# Patient Record
Sex: Male | Born: 1970 | Race: Black or African American | Hispanic: No | Marital: Single | Smoking: Never smoker
Health system: Southern US, Community
[De-identification: ages and names within clinical notes are randomized; demographics above are authoritative.]

## PROBLEM LIST (undated history)

## (undated) DIAGNOSIS — J45909 Unspecified asthma, uncomplicated: Secondary | ICD-10-CM

## (undated) DIAGNOSIS — K56609 Unspecified intestinal obstruction, unspecified as to partial versus complete obstruction: Secondary | ICD-10-CM

## (undated) DIAGNOSIS — K567 Ileus, unspecified: Secondary | ICD-10-CM

## (undated) DIAGNOSIS — J40 Bronchitis, not specified as acute or chronic: Secondary | ICD-10-CM

## (undated) DIAGNOSIS — K529 Noninfective gastroenteritis and colitis, unspecified: Secondary | ICD-10-CM

## (undated) DIAGNOSIS — K9189 Other postprocedural complications and disorders of digestive system: Secondary | ICD-10-CM

## (undated) HISTORY — PX: BOWEL RESECTION: SHX1257

## (undated) HISTORY — PX: ABDOMINAL SURGERY: SHX537

---

## 2007-06-24 ENCOUNTER — Emergency Department (HOSPITAL_COMMUNITY): Admission: EM | Admit: 2007-06-24 | Discharge: 2007-06-24 | Payer: Self-pay | Admitting: Emergency Medicine

## 2009-02-19 ENCOUNTER — Emergency Department (HOSPITAL_COMMUNITY): Admission: EM | Admit: 2009-02-19 | Discharge: 2009-02-19 | Payer: Self-pay | Admitting: Emergency Medicine

## 2009-03-12 ENCOUNTER — Emergency Department (HOSPITAL_COMMUNITY): Admission: EM | Admit: 2009-03-12 | Discharge: 2009-03-12 | Payer: Self-pay | Admitting: Emergency Medicine

## 2009-09-04 ENCOUNTER — Emergency Department (HOSPITAL_COMMUNITY): Admission: EM | Admit: 2009-09-04 | Discharge: 2009-09-04 | Payer: Self-pay | Admitting: Emergency Medicine

## 2010-04-25 LAB — GC/CHLAMYDIA PROBE AMP, URINE: Chlamydia, Swab/Urine, PCR: POSITIVE — AB

## 2010-04-26 LAB — URINE MICROSCOPIC-ADD ON

## 2010-04-26 LAB — URINALYSIS, ROUTINE W REFLEX MICROSCOPIC
Glucose, UA: NEGATIVE mg/dL
Nitrite: NEGATIVE
Protein, ur: NEGATIVE mg/dL
Urobilinogen, UA: 1 mg/dL (ref 0.0–1.0)

## 2010-04-26 LAB — URINE CULTURE

## 2010-04-26 LAB — GC/CHLAMYDIA PROBE AMP, GENITAL: Chlamydia, DNA Probe: POSITIVE — AB

## 2010-04-29 LAB — URINALYSIS, ROUTINE W REFLEX MICROSCOPIC
Bilirubin Urine: NEGATIVE
Ketones, ur: NEGATIVE mg/dL
Protein, ur: NEGATIVE mg/dL
Specific Gravity, Urine: 1.039 — ABNORMAL HIGH (ref 1.005–1.030)
pH: 7 (ref 5.0–8.0)

## 2010-07-25 ENCOUNTER — Emergency Department: Payer: Self-pay | Admitting: Internal Medicine

## 2010-08-04 ENCOUNTER — Emergency Department: Payer: Self-pay | Admitting: Emergency Medicine

## 2010-08-18 ENCOUNTER — Emergency Department: Payer: Self-pay | Admitting: Emergency Medicine

## 2010-08-21 ENCOUNTER — Emergency Department (HOSPITAL_COMMUNITY)
Admission: EM | Admit: 2010-08-21 | Discharge: 2010-08-21 | Disposition: A | Discharge status: Home / Self Care | Payer: Self-pay | Attending: Emergency Medicine | Admitting: Emergency Medicine

## 2010-08-21 DIAGNOSIS — M6740 Ganglion, unspecified site: Secondary | ICD-10-CM | POA: Insufficient documentation

## 2010-11-04 LAB — D-DIMER, QUANTITATIVE: D-Dimer, Quant: <0.22

## 2010-11-04 LAB — POCT I-STAT, CHEM 8
Calcium, Ion: 1.16
Chloride: 104
Potassium: 4.3
Sodium: 139
TCO2: 27

## 2010-11-04 LAB — DIFFERENTIAL
Lymphocytes Relative: 31
Lymphs Abs: 1.3
Monocytes Relative: 8
Neutro Abs: 2.2

## 2010-11-04 LAB — CBC
HCT: 45.2
MCHC: 34.1
RBC: 5.2
RDW: 13.3

## 2010-11-04 LAB — POCT CARDIAC MARKERS: CKMB, poc: 1.1

## 2011-01-31 ENCOUNTER — Inpatient Hospital Stay: Payer: Self-pay | Admitting: Surgery

## 2011-02-11 LAB — CREATININE, SERUM
Creatinine: 1.38 mg/dL — ABNORMAL HIGH (ref 0.60–1.30)
EGFR (African American): >60

## 2011-06-14 ENCOUNTER — Ambulatory Visit: Payer: Self-pay | Admitting: Pediatrics

## 2011-10-18 ENCOUNTER — Emergency Department (HOSPITAL_COMMUNITY): Payer: Self-pay

## 2011-10-18 ENCOUNTER — Encounter (HOSPITAL_COMMUNITY): Payer: Self-pay | Admitting: *Deleted

## 2011-10-18 DIAGNOSIS — W108XXA Fall (on) (from) other stairs and steps, initial encounter: Secondary | ICD-10-CM | POA: Insufficient documentation

## 2011-10-18 DIAGNOSIS — Z91013 Allergy to seafood: Secondary | ICD-10-CM | POA: Insufficient documentation

## 2011-10-18 DIAGNOSIS — S62309A Unspecified fracture of unspecified metacarpal bone, initial encounter for closed fracture: Secondary | ICD-10-CM | POA: Insufficient documentation

## 2011-10-18 DIAGNOSIS — M545 Low back pain, unspecified: Secondary | ICD-10-CM | POA: Insufficient documentation

## 2011-10-18 DIAGNOSIS — J45909 Unspecified asthma, uncomplicated: Secondary | ICD-10-CM | POA: Insufficient documentation

## 2011-10-18 NOTE — ED Notes (Signed)
Was going down steps and started to fall, threw arm out to catch fall. C/o L hand pain along little finger/ulnar side and low back pain. Denies wrist pain or other pains or injuries. Swelling noted. No obvious deformity or bruising.

## 2011-10-19 ENCOUNTER — Emergency Department (HOSPITAL_COMMUNITY)
Admission: EM | Admit: 2011-10-19 | Discharge: 2011-10-19 | Disposition: A | Discharge status: Home / Self Care | Payer: Self-pay | Attending: Emergency Medicine | Admitting: Emergency Medicine

## 2011-10-19 ENCOUNTER — Emergency Department (HOSPITAL_COMMUNITY): Payer: Self-pay

## 2011-10-19 DIAGNOSIS — S6292XA Unspecified fracture of left wrist and hand, initial encounter for closed fracture: Secondary | ICD-10-CM

## 2011-10-19 HISTORY — DX: Bronchitis, not specified as acute or chronic: J40

## 2011-10-19 HISTORY — DX: Unspecified asthma, uncomplicated: J45.909

## 2011-10-19 HISTORY — DX: Unspecified intestinal obstruction, unspecified as to partial versus complete obstruction: K56.609

## 2011-10-19 MED ORDER — ACETAMINOPHEN 325 MG PO TABS
650.0000 mg | ORAL_TABLET | Freq: Once | ORAL | Status: AC
  Administered 2011-10-19: 650 mg via ORAL
  Filled 2011-10-19: qty 2

## 2011-10-19 MED ORDER — HYDROCODONE-ACETAMINOPHEN 5-325 MG PO TABS: 2.0000 | ORAL_TABLET | ORAL | Status: AC | PRN

## 2011-10-19 NOTE — ED Provider Notes (Signed)
History  This chart was scribed for Doug Sou, MD by Albertha Ghee Rifaie. This patient was seen in room TR04C/TR04C and the patient's care was started at 12:40AM.  CSN: [id number]  Arrival date & time 10/18/11  2245   None     Chief Complaint  Patient presents with  . Hand Injury  . Fall  . Back Pain     The history is provided by the patient. No language interpreter was used.    Gregory Castro is a 41 y.o. male who presents to the Emergency Department complaining of 8 hours of constant left hand pain and lower back pain after he threw his left arm out to catch himself as he was falling down a flight of stairs. The hand pain is worse with use of the hand and the back pain is worse with movement. He denies left wrist pain or other injuries. He denies taking OTC medications at home to improve symptoms. He denies fever, nausea and emesis as associated symptoms. He has a h/o asthma and SBO. Pt denies smoking and alcohol use.   Past Medical History  Diagnosis Date  . Bronchitis   . Asthma   . SBO (small bowel obstruction)     Past Surgical History  Procedure Date  . Abdominal surgery     No family history on file.  History  Substance Use Topics  . Smoking status: Never Smoker   . Smokeless tobacco: Not on file  . Alcohol Use: No      Review of Systems  Constitutional: Negative.   HENT: Negative.   Respiratory: Negative.   Cardiovascular: Negative.   Gastrointestinal: Negative.   Musculoskeletal: Positive for back pain. Negative for gait problem.       Left hand pain  Skin: Negative.   Neurological: Negative.   Hematological: Negative.   Psychiatric/Behavioral: Negative.     Allergies  Shellfish allergy  Home Medications  No current outpatient prescriptions on file.  Triage Vitals: BP 106/70  Pulse 80  Temp 97.5 F (36.4 C) (Oral)  Resp 18  SpO2 96%  Physical Exam  Nursing note and vitals reviewed. Constitutional: He is oriented to person, place,  and time. He appears well-developed and well-nourished.  HENT:  Head: Normocephalic and atraumatic.  Eyes: Conjunctivae are normal. Pupils are equal, round, and reactive to light.  Neck: Neck supple. No tracheal deviation present. No thyromegaly present.  Cardiovascular: Normal rate and regular rhythm.   No murmur heard. Pulmonary/Chest: Effort normal and breath sounds normal.  Abdominal: Soft. Bowel sounds are normal. He exhibits no distension. There is no tenderness.  Musculoskeletal: He exhibits tenderness. He exhibits no edema.       Tender over lumbar spine, tenderness over the right dorsal lateral aspect of left hand. Skin of left hand intact All other extremities no contusion abrasion or tenderness neurovascular intact pelvis stable nontender  Neurological: He is alert and oriented to person, place, and time. Coordination normal.       gait is normal  Skin: Skin is warm and dry. No rash noted.  Psychiatric: He has a normal mood and affect.    ED Course  Procedures (including critical care time)  DIAGNOSTIC STUDIES: Oxygen Saturation is 96% on room air, adequate by my interpretation.    COORDINATION OF CARE: 12:44AM-Informed pt of radiology report showing a left hand fracture. Discussed treatment plan which includes a back x-ray with pt at bedside and pt agreed to plan. Informed pt that he will have to  follow up with a hand specialist.  Splint applied by ortho tech. Comfortable for pt  Labs Reviewed - No data to display Dg Hand Complete Left  10/19/2011  *RADIOLOGY REPORT*  Clinical Data: Fifth metacarpal pain and swelling after fall down steps.  LEFT HAND - COMPLETE 3+ VIEW  Comparison: None.  Findings: Transverse fracture of the distal aspect of the left fifth metacarpal with volar angulation of the distal fracture fragments.  No additional fractures demonstrated.  Degenerative changes in the first metacarpal phalangeal joint.  IMPRESSION: Transverse fracture of the distal left  fifth metacarpal bone with mild volar angulation.   Original Report Authenticated By: Marlon Pel, M.D.      No diagnosis found.  Lumbosacral and hand x-rays reviewed by me  MDM  Case discussed with Dr.Weingold plan prescription Norco, ulnar gutter splint, follow up in office one to 2 days Diagnosis #1 fall #2 closed fracture of left fifth metacarpal #3 contusion to back   I personally performed the services described in this documentation, which was scribed in my presence. The recorded information has been reviewed and considered.        Doug Sou, MD 10/19/11 [phone]

## 2011-10-27 ENCOUNTER — Ambulatory Visit: Payer: Self-pay | Attending: Orthopedic Surgery | Admitting: Occupational Therapy

## 2011-10-27 DIAGNOSIS — IMO0001 Reserved for inherently not codable concepts without codable children: Secondary | ICD-10-CM | POA: Insufficient documentation

## 2011-10-27 DIAGNOSIS — M25549 Pain in joints of unspecified hand: Secondary | ICD-10-CM | POA: Insufficient documentation

## 2012-01-03 ENCOUNTER — Encounter (HOSPITAL_COMMUNITY): Payer: Self-pay | Admitting: Neurology

## 2012-01-03 ENCOUNTER — Emergency Department (HOSPITAL_COMMUNITY): Payer: Self-pay

## 2012-01-03 ENCOUNTER — Emergency Department (HOSPITAL_COMMUNITY)
Admission: EM | Admit: 2012-01-03 | Discharge: 2012-01-03 | Disposition: A | Discharge status: Home / Self Care | Payer: Self-pay | Attending: Emergency Medicine | Admitting: Emergency Medicine

## 2012-01-03 DIAGNOSIS — R109 Unspecified abdominal pain: Secondary | ICD-10-CM | POA: Insufficient documentation

## 2012-01-03 DIAGNOSIS — Z8719 Personal history of other diseases of the digestive system: Secondary | ICD-10-CM | POA: Insufficient documentation

## 2012-01-03 DIAGNOSIS — K5900 Constipation, unspecified: Secondary | ICD-10-CM | POA: Insufficient documentation

## 2012-01-03 DIAGNOSIS — J45909 Unspecified asthma, uncomplicated: Secondary | ICD-10-CM | POA: Insufficient documentation

## 2012-01-03 DIAGNOSIS — Z8709 Personal history of other diseases of the respiratory system: Secondary | ICD-10-CM | POA: Insufficient documentation

## 2012-01-03 LAB — COMPREHENSIVE METABOLIC PANEL
AST: 19 U/L (ref 0–37)
Albumin: 3.5 g/dL (ref 3.5–5.2)
CO2: 29 mEq/L (ref 19–32)
Calcium: 8.9 mg/dL (ref 8.4–10.5)
Creatinine, Ser: 1.37 mg/dL — ABNORMAL HIGH (ref 0.50–1.35)
GFR calc Af Amer: 73 mL/min — ABNORMAL LOW (ref >90)
Glucose, Bld: 96 mg/dL (ref 70–99)
Sodium: 140 mEq/L (ref 135–145)
Total Bilirubin: 0.6 mg/dL (ref 0.3–1.2)
Total Protein: 6.7 g/dL (ref 6.0–8.3)

## 2012-01-03 LAB — CBC WITH DIFFERENTIAL/PLATELET
Basophils Absolute: 0 10*3/uL (ref 0.0–0.1)
Eosinophils Absolute: 0.2 10*3/uL (ref 0.0–0.7)
Eosinophils Relative: 4 % (ref 0–5)
Hemoglobin: 14.7 g/dL (ref 13.0–17.0)
MCH: 29.3 pg (ref 26.0–34.0)
MCV: 83.2 fL (ref 78.0–100.0)
Monocytes Absolute: 0.4 10*3/uL (ref 0.1–1.0)
Neutro Abs: 2.4 10*3/uL (ref 1.7–7.7)
RBC: 5.01 MIL/uL (ref 4.22–5.81)
WBC: 3.7 10*3/uL — ABNORMAL LOW (ref 4.0–10.5)

## 2012-01-03 LAB — URINALYSIS, ROUTINE W REFLEX MICROSCOPIC
Bilirubin Urine: NEGATIVE
Hgb urine dipstick: NEGATIVE
Ketones, ur: NEGATIVE mg/dL
Leukocytes, UA: NEGATIVE
Protein, ur: NEGATIVE mg/dL
Urobilinogen, UA: 0.2 mg/dL (ref 0.0–1.0)

## 2012-01-03 MED ORDER — IBUPROFEN 800 MG PO TABS
800.0000 mg | ORAL_TABLET | Freq: Once | ORAL | Status: AC
  Administered 2012-01-03: 800 mg via ORAL
  Filled 2012-01-03: qty 1

## 2012-01-03 NOTE — ED Notes (Signed)
Pt reporting LLQ abdominal pain radiates across abdomen. Last BM yesterday. Pain 8/10, "cramping". Hx of bowel obstruction x 2. Denying any urinary s/s. No vomiting at this time. Pt a x 4. NAD

## 2012-01-03 NOTE — ED Provider Notes (Signed)
History     CSN: [id number]  Arrival date & time 01/03/12  4098   First MD Initiated Contact with Patient 01/03/12 [phone]      Chief Complaint  Patient presents with  . Abdominal Pain    (Consider location/radiation/quality/duration/timing/severity/associated sxs/prior treatment) HPI Comments: 41 y/o male presents to the ED complaining of lower abdominal pain beginning last night. Describes the pain as cramping, constant rated 8/10. Has not tried any alleviating factors. This morning when he felt as if he needed to move his bowels he was unable to do so. States this feels similar to his bowel obstruction which was operated on last year. Denies nausea, vomiting, fever, chills, chest pain, sob. He does not believe his abdomen looks or feels distended.  Patient is a 41 y.o. male presenting with abdominal pain. The history is provided by the patient.  Abdominal Pain The primary symptoms of the illness include abdominal pain. The primary symptoms of the illness do not include fever, shortness of breath, nausea or vomiting.  Additional symptoms associated with the illness include constipation. Symptoms associated with the illness do not include chills.    Past Medical History  Diagnosis Date  . Bronchitis   . Asthma   . SBO (small bowel obstruction)     Past Surgical History  Procedure Date  . Abdominal surgery     No family history on file.  History  Substance Use Topics  . Smoking status: Never Smoker   . Smokeless tobacco: Not on file  . Alcohol Use: No      Review of Systems  Constitutional: Negative for fever, chills and appetite change.  HENT: Negative.   Respiratory: Negative for shortness of breath.   Cardiovascular: Negative for chest pain.  Gastrointestinal: Positive for abdominal pain and constipation. Negative for nausea and vomiting.  Genitourinary: Negative.   Musculoskeletal: Negative.   Skin: Negative.   Neurological: Negative.     Psychiatric/Behavioral: Negative.     Allergies  Shellfish allergy  Home Medications  No current outpatient prescriptions on file.  BP 111/75  Pulse 69  Temp 97.6 F (36.4 C) (Oral)  Resp 16  SpO2 100%  Physical Exam  Nursing note and vitals reviewed. Constitutional: He is oriented to person, place, and time. He appears well-developed and well-nourished. No distress.  HENT:  Head: Normocephalic and atraumatic.  Mouth/Throat: Oropharynx is clear and moist.  Eyes: Conjunctivae normal are normal.  Neck: Normal range of motion. Neck supple.  Cardiovascular: Normal rate, regular rhythm and normal heart sounds.   Pulmonary/Chest: Effort normal and breath sounds normal.  Abdominal: Soft. Normal appearance and bowel sounds are normal. He exhibits no distension. There is tenderness in the right lower quadrant, periumbilical area and left lower quadrant. There is guarding. There is no rigidity and no rebound.       No peritoneal signs.  Musculoskeletal: Normal range of motion. He exhibits no edema.  Neurological: He is alert and oriented to person, place, and time.  Skin: Skin is warm and dry.  Psychiatric: He has a normal mood and affect. His behavior is normal.    ED Course  Procedures (including critical care time)  Labs Reviewed  CBC WITH DIFFERENTIAL - Abnormal; Notable for the following:    WBC 3.7 (*)     Platelets 113 (*)  PLATELET COUNT CONFIRMED BY SMEAR   All other components within normal limits  COMPREHENSIVE METABOLIC PANEL - Abnormal; Notable for the following:    Creatinine, Ser 1.37 (*)  GFR calc non Af Amer 63 (*)     GFR calc Af Amer 73 (*)     All other components within normal limits  URINALYSIS, ROUTINE W REFLEX MICROSCOPIC   Dg Abd Acute W/chest  01/03/2012  *RADIOLOGY REPORT*  Clinical Data: Abdominal pain, diarrhea  ACUTE ABDOMEN SERIES (ABDOMEN 2 VIEW & CHEST 1 VIEW)  Comparison: Abdominal radiograph dated 03/12/2009  Findings: Lungs are clear.   No pleural effusion or pneumothorax.  Nonobstructive bowel gas pattern.  No evidence of free air under the diaphragm on the upright view.  Visualized osseous structures are within normal limits.  Surgical clip overlying the pelvis.  IMPRESSION: No evidence of acute cardiopulmonary disease.  No evidence of small bowel obstruction or free air.   Original Report Authenticated By: Charline Bills, M.D.      1. Abdominal pain   2. Constipation       MDM  41 year old male with abdominal pain and constipation. Abdomen is soft and nondistended. Acute abdominal series without any acute abnormality. Labs normal. I advised patient to take stool softeners and increase fiber in his diet. I gave him 800 mg ibuprofen in the emergency department today with improvement of his pain. Pain to palpation has decreased on repeat exam. Patient is stable for discharge.       Trevor Mace, PA-C 01/03/12 1150

## 2012-01-04 NOTE — ED Provider Notes (Signed)
Medical screening examination/treatment/procedure(s) were performed by non-physician practitioner and as supervising physician I was immediately available for consultation/collaboration.  Ivanna Kocak, MD 01/04/12 2338 

## 2012-05-10 ENCOUNTER — Emergency Department (HOSPITAL_COMMUNITY)
Admission: EM | Admit: 2012-05-10 | Discharge: 2012-05-10 | Disposition: A | Discharge status: Home / Self Care | Payer: Self-pay | Attending: Emergency Medicine | Admitting: Emergency Medicine

## 2012-05-10 ENCOUNTER — Emergency Department (HOSPITAL_COMMUNITY): Payer: Self-pay

## 2012-05-10 ENCOUNTER — Encounter (HOSPITAL_COMMUNITY): Payer: Self-pay | Admitting: *Deleted

## 2012-05-10 DIAGNOSIS — R197 Diarrhea, unspecified: Secondary | ICD-10-CM | POA: Insufficient documentation

## 2012-05-10 DIAGNOSIS — K529 Noninfective gastroenteritis and colitis, unspecified: Secondary | ICD-10-CM

## 2012-05-10 DIAGNOSIS — J45909 Unspecified asthma, uncomplicated: Secondary | ICD-10-CM | POA: Insufficient documentation

## 2012-05-10 DIAGNOSIS — Z8719 Personal history of other diseases of the digestive system: Secondary | ICD-10-CM | POA: Insufficient documentation

## 2012-05-10 DIAGNOSIS — R1110 Vomiting, unspecified: Secondary | ICD-10-CM | POA: Insufficient documentation

## 2012-05-10 DIAGNOSIS — K5289 Other specified noninfective gastroenteritis and colitis: Secondary | ICD-10-CM | POA: Insufficient documentation

## 2012-05-10 DIAGNOSIS — Z79899 Other long term (current) drug therapy: Secondary | ICD-10-CM | POA: Insufficient documentation

## 2012-05-10 LAB — COMPREHENSIVE METABOLIC PANEL
ALT: 31 U/L (ref 0–53)
Albumin: 3.7 g/dL (ref 3.5–5.2)
Alkaline Phosphatase: 106 U/L (ref 39–117)
Glucose, Bld: 98 mg/dL (ref 70–99)
Potassium: 3.9 mEq/L (ref 3.5–5.1)
Sodium: 139 mEq/L (ref 135–145)
Total Protein: 7.3 g/dL (ref 6.0–8.3)

## 2012-05-10 LAB — CBC WITH DIFFERENTIAL/PLATELET
Basophils Relative: 1 % (ref 0–1)
Eosinophils Absolute: 0.2 10*3/uL (ref 0.0–0.7)
Lymphs Abs: 0.6 10*3/uL — ABNORMAL LOW (ref 0.7–4.0)
MCH: 29.5 pg (ref 26.0–34.0)
Neutro Abs: 2.6 10*3/uL (ref 1.7–7.7)
Neutrophils Relative %: 69 % (ref 43–77)
Platelets: 133 10*3/uL — ABNORMAL LOW (ref 150–400)
RBC: 5.49 MIL/uL (ref 4.22–5.81)

## 2012-05-10 MED ORDER — ONDANSETRON HCL 4 MG PO TABS: 4.0000 mg | ORAL_TABLET | Freq: Three times a day (TID) | ORAL | Status: DC | PRN

## 2012-05-10 MED ORDER — SODIUM CHLORIDE 0.9 % IV BOLUS (SEPSIS)
1000.0000 mL | Freq: Once | INTRAVENOUS | Status: AC
  Administered 2012-05-10: 1000 mL via INTRAVENOUS

## 2012-05-10 MED ORDER — HYDROMORPHONE HCL PF 1 MG/ML IJ SOLN
1.0000 mg | Freq: Once | INTRAMUSCULAR | Status: AC
  Administered 2012-05-10: 1 mg via INTRAVENOUS
  Filled 2012-05-10: qty 1

## 2012-05-10 MED ORDER — ONDANSETRON HCL 4 MG/2ML IJ SOLN
4.0000 mg | Freq: Once | INTRAMUSCULAR | Status: AC
  Administered 2012-05-10: 4 mg via INTRAVENOUS
  Filled 2012-05-10: qty 2

## 2012-05-10 MED ORDER — OXYCODONE-ACETAMINOPHEN 5-325 MG PO TABS: 1.0000 | ORAL_TABLET | Freq: Four times a day (QID) | ORAL | Status: DC | PRN

## 2012-05-10 NOTE — ED Notes (Signed)
Pt reports onset of mid abd pain since yesterday, having n/v/d also. Hx of blockage.

## 2012-05-10 NOTE — ED Notes (Signed)
Pt c/o mid abd pain that started yesterday along with n/v/d. Pt reports his stools have been loose and dark black. Pt reports he hasn't been able to keep any food down. Pt in nad, laying in bed, resp e/u, skin warm and dry.

## 2012-05-10 NOTE — ED Notes (Signed)
Pt asked for urine sample.

## 2012-05-10 NOTE — ED Provider Notes (Signed)
History     CSN: [id number]  Arrival date & time 05/10/12  1059   First MD Initiated Contact with Patient 05/10/12 1230      Chief Complaint  Patient presents with  . Abdominal Pain    (Consider location/radiation/quality/duration/timing/severity/associated sxs/prior treatment) Patient is a 42 y.o. male presenting with abdominal pain.  Abdominal Pain  Pt with history of SBO about a year ago, presumably related to scar tissue from abdominal surgery as an infant. He reports moderate to severe diffuse abdominal cramping since yesterday, associated with multiple episodes of non-blood vomiting and diarrhea. Other people in his household have had GI symptoms recently as well.   Past Medical History  Diagnosis Date  . Bronchitis   . Asthma   . SBO (small bowel obstruction)     Past Surgical History  Procedure Laterality Date  . Abdominal surgery      History reviewed. No pertinent family history.  History  Substance Use Topics  . Smoking status: Never Smoker   . Smokeless tobacco: Not on file  . Alcohol Use: No      Review of Systems  Gastrointestinal: Positive for abdominal pain.   All other systems reviewed and are negative except as noted in HPI.   Allergies  Shellfish allergy  Home Medications   Current Outpatient Rx  Name  Route  Sig  Dispense  Refill  . Chlorphen-Phenyleph-APAP 2-5-325 & 5-325 MG MISC   Oral   Take 2 tablets by mouth.         . diphenhydrAMINE (SOMINEX) 25 MG tablet   Oral   Take 25 mg by mouth daily as needed for allergies or sleep.         . Pseudoeph-Doxylamine-DM-APAP (NYQUIL PO)   Oral   Take 2 tablets by mouth at bedtime as needed (for sleep).           BP 130/79  Pulse 88  Temp(Src) 98.3 F (36.8 C) (Oral)  Resp 16  SpO2 98%  Physical Exam  Nursing note and vitals reviewed. Constitutional: He is oriented to person, place, and time. He appears well-developed and well-nourished.  HENT:  Head: Normocephalic and  atraumatic.  Eyes: EOM are normal. Pupils are equal, round, and reactive to light.  Neck: Normal range of motion. Neck supple.  Cardiovascular: Normal rate, normal heart sounds and intact distal pulses.   Pulmonary/Chest: Effort normal and breath sounds normal.  Abdominal: Soft. Bowel sounds are normal. He exhibits no distension. There is tenderness (diffuse). There is no rebound and no guarding.  Musculoskeletal: Normal range of motion. He exhibits no edema and no tenderness.  Neurological: He is alert and oriented to person, place, and time. He has normal strength. No cranial nerve deficit or sensory deficit.  Skin: Skin is warm and dry. No rash noted.  Psychiatric: He has a normal mood and affect.    ED Course  Procedures (including critical care time)  Labs Reviewed  CBC WITH DIFFERENTIAL - Abnormal; Notable for the following:    WBC 3.8 (*)    Platelets 133 (*)    Lymphs Abs 0.6 (*)    All other components within normal limits  COMPREHENSIVE METABOLIC PANEL - Abnormal; Notable for the following:    GFR calc non Af Amer 83 (*)    All other components within normal limits  LIPASE, BLOOD   Dg Abd Acute W/chest  05/10/2012  *RADIOLOGY REPORT*  Clinical Data: Mid abdominal pain.  History of small bowel obstruction.  ACUTE  ABDOMEN SERIES (ABDOMEN 2 VIEW & CHEST 1 VIEW)  Comparison: Acute abdominal series 01/03/2012.  Findings: Heart size is normal.  The lungs are clear.  The visualized soft tissues and bony thorax are unremarkable.  The bowel gas pattern is unremarkable.  There is no evidence for obstruction or free air.  The axial skeleton is unremarkable.  IMPRESSION: No acute abnormality of the chest or abdomen.   Original Report Authenticated By: Marin Roberts, M.D.      1. Gastroenteritis       MDM  Doubt this is SBO, but given history will send for AAS. Labs done in triage otherwise unremarkable. Will give IVF, pain and nausea meds and reassess.   2:14 PM PT feeling  better, AAS neg for signs of SBO. Will give PO trial and anticipate discharge.      Charles B. Bernette Mayers, MD 05/10/12 1419

## 2012-06-23 ENCOUNTER — Emergency Department (HOSPITAL_COMMUNITY): Payer: Self-pay

## 2012-06-23 ENCOUNTER — Emergency Department (HOSPITAL_COMMUNITY)
Admission: EM | Admit: 2012-06-23 | Discharge: 2012-06-23 | Disposition: A | Discharge status: Home / Self Care | Payer: Self-pay | Attending: Emergency Medicine | Admitting: Emergency Medicine

## 2012-06-23 ENCOUNTER — Encounter (HOSPITAL_COMMUNITY): Payer: Self-pay | Admitting: Emergency Medicine

## 2012-06-23 DIAGNOSIS — J45909 Unspecified asthma, uncomplicated: Secondary | ICD-10-CM | POA: Insufficient documentation

## 2012-06-23 DIAGNOSIS — Z8719 Personal history of other diseases of the digestive system: Secondary | ICD-10-CM | POA: Insufficient documentation

## 2012-06-23 DIAGNOSIS — R109 Unspecified abdominal pain: Secondary | ICD-10-CM

## 2012-06-23 DIAGNOSIS — Z9889 Other specified postprocedural states: Secondary | ICD-10-CM | POA: Insufficient documentation

## 2012-06-23 DIAGNOSIS — R112 Nausea with vomiting, unspecified: Secondary | ICD-10-CM | POA: Insufficient documentation

## 2012-06-23 LAB — CBC WITH DIFFERENTIAL/PLATELET
Basophils Relative: 0 % (ref 0–1)
Eosinophils Absolute: 0 10*3/uL (ref 0.0–0.7)
Eosinophils Relative: 1 % (ref 0–5)
HCT: 38.2 % — ABNORMAL LOW (ref 39.0–52.0)
Hemoglobin: 13.7 g/dL (ref 13.0–17.0)
MCH: 29.3 pg (ref 26.0–34.0)
MCHC: 35.9 g/dL (ref 30.0–36.0)
MCV: 81.6 fL (ref 78.0–100.0)
Monocytes Absolute: 0.4 10*3/uL (ref 0.1–1.0)
Monocytes Relative: 9 % (ref 3–12)

## 2012-06-23 LAB — BASIC METABOLIC PANEL
BUN: 11 mg/dL (ref 6–23)
Calcium: 9.2 mg/dL (ref 8.4–10.5)
Chloride: 102 mEq/L (ref 96–112)
Creatinine, Ser: 1.29 mg/dL (ref 0.50–1.35)

## 2012-06-23 LAB — POCT PREGNANCY, URINE: Preg Test, Ur: NEGATIVE

## 2012-06-23 LAB — LIPASE, BLOOD: Lipase: 26 U/L (ref 11–59)

## 2012-06-23 LAB — AMYLASE: Amylase: 62 U/L (ref 0–105)

## 2012-06-23 MED ORDER — HYDROMORPHONE HCL PF 1 MG/ML IJ SOLN
1.0000 mg | Freq: Once | INTRAMUSCULAR | Status: AC
  Administered 2012-06-23: 1 mg via INTRAVENOUS
  Filled 2012-06-23: qty 1

## 2012-06-23 MED ORDER — ONDANSETRON HCL 4 MG/2ML IJ SOLN
4.0000 mg | Freq: Once | INTRAMUSCULAR | Status: DC
  Filled 2012-06-23: qty 2

## 2012-06-23 MED ORDER — ONDANSETRON HCL 4 MG/2ML IJ SOLN
4.0000 mg | Freq: Once | INTRAMUSCULAR | Status: AC
  Administered 2012-06-23: 4 mg via INTRAVENOUS
  Filled 2012-06-23: qty 2

## 2012-06-23 MED ORDER — HYDROMORPHONE HCL PF 1 MG/ML IJ SOLN
1.0000 mg | Freq: Once | INTRAMUSCULAR | Status: DC
  Filled 2012-06-23: qty 1

## 2012-06-23 MED ORDER — SODIUM CHLORIDE 0.9 % IV BOLUS (SEPSIS)
1000.0000 mL | Freq: Once | INTRAVENOUS | Status: AC
  Administered 2012-06-23: 1000 mL via INTRAVENOUS

## 2012-06-23 MED ORDER — SODIUM CHLORIDE 0.9 % IV BOLUS (SEPSIS): 1000.0000 mL | Freq: Once | INTRAVENOUS | Status: DC

## 2012-06-23 MED ORDER — ONDANSETRON HCL 4 MG PO TABS: 4.0000 mg | ORAL_TABLET | Freq: Four times a day (QID) | ORAL | Status: DC

## 2012-06-23 NOTE — ED Notes (Signed)
Patient also states he had a bowel movement earlier tonight and noticed blood in his stool.

## 2012-06-23 NOTE — ED Notes (Signed)
Patient requesting pain medication at this time. PA, Laveda Norman notified.

## 2012-06-23 NOTE — ED Notes (Signed)
Patient comes in complaining of abdominal pain that started around midnight. Patient also having nausea. Denies any vomiting at this time. Patient states he had a bowel obstructions 9 months ago and pain feels the same.

## 2012-06-23 NOTE — ED Notes (Signed)
Pt given water and crackers for PO challenge

## 2012-06-23 NOTE — ED Notes (Signed)
pT

## 2012-06-23 NOTE — ED Provider Notes (Signed)
History     CSN: [id number]  Arrival date & time 06/23/12  0446   First MD Initiated Contact with Patient 06/23/12 [phone]      Chief Complaint  Patient presents with  . Abdominal Pain    (Consider location/radiation/quality/duration/timing/severity/associated sxs/prior treatment) HPI  42 year old male with prior history of abdominal surgery, small bowel obstruction presents complaining of abdominal pain. Patient reports his pain is been ongoing for the past 6 hours. Pain is throughout his abdomen, a sharp and stabbing, pain similar to prior small bowel obstruction.  He endorsed nausea but without vomiting. His last bowel movement was 6 hours ago, with trace of bright red blood on tissue paper. Denies any rectal pain at that time. Denies headache, lightheadedness, fever, chills, chest pain, shortness of breath, back pain, dysuria, hernia, or rash. No recent trauma, no recent alcohol use.  Past Medical History  Diagnosis Date  . Bronchitis   . Asthma   . SBO (small bowel obstruction)     Past Surgical History  Procedure Laterality Date  . Abdominal surgery      History reviewed. No pertinent family history.  History  Substance Use Topics  . Smoking status: Never Smoker   . Smokeless tobacco: Not on file  . Alcohol Use: No      Review of Systems  Constitutional:       A complete 10 system review of systems was obtained and all systems are negative except as noted in the HPI and PMH.    Allergies  Shellfish allergy  Home Medications  No current outpatient prescriptions on file.  BP 101/65  Pulse 87  Temp(Src) 97.7 F (36.5 C) (Oral)  Resp 20  SpO2 96%  Physical Exam  Nursing note and vitals reviewed. Constitutional: He appears well-developed and well-nourished. No distress.  Awake, alert, nontoxic appearance  HENT:  Head: Atraumatic.  Eyes: Conjunctivae are normal. Right eye exhibits no discharge. Left eye exhibits no discharge.  Neck: Normal range of  motion. Neck supple.  Cardiovascular: Normal rate and regular rhythm.   Pulmonary/Chest: Effort normal. No respiratory distress. He exhibits no tenderness.  Abdominal: Soft. There is tenderness (generalized abdominal tenderness on palpation without guarding or rebound tenderness. Well-healing midabdominal surgical scar without hernia noted.). There is no rebound.  Bowel sounds are decreased  Genitourinary:  No hernia noted.  Musculoskeletal: He exhibits no tenderness.  ROM appears intact, no obvious focal weakness  Neurological: He is alert.  Skin: Skin is warm and dry. No rash noted.  Psychiatric: He has a normal mood and affect.    ED Course  Procedures (including critical care time)  7:03 AM Patient presents with generalized abdominal tenderness. History of small bowel obstruction.  Acute abdominal series shows no radiographic evidence of any acute cardiopulmonary process. Nonspecific bowel gas pattern without complete obstruction. Patient's labs otherwise unremarkable. He is afebrile with stable normal vital sign. Plan is to control his pain, discharge once patient is able to tolerates by mouth.  Care discussed with attending.    8:12 AM Pt continues to endorse abd pain after receiving dilaudid 1mg  1 hr ago.  WIll continue pain management, IVF, and monitor.    9:26 AM Pt is sleeping, no worsening of abdominal pain.  Plan to have pt on clear liquid diet and pt can return in 24 hrs for serial abdominal exam if pain worsen.  Otherwise pt stable for discharge at this time. Pt able to tolerates PO fluid at this time.   Labs Reviewed  CBC WITH DIFFERENTIAL - Abnormal; Notable for the following:    HCT 38.2 (*)    All other components within normal limits  BASIC METABOLIC PANEL - Abnormal; Notable for the following:    Glucose, Bld 102 (*)    GFR calc non Af Amer 67 (*)    GFR calc Af Amer 78 (*)    All other components within normal limits  LIPASE, BLOOD  AMYLASE  OCCULT BLOOD X 1  CARD TO LAB, STOOL  POCT PREGNANCY, URINE  OCCULT BLOOD, POC DEVICE   Dg Abd Acute W/chest  06/23/2012   *RADIOLOGY REPORT*  Clinical Data: Abdominal pain  ACUTE ABDOMEN SERIES (ABDOMEN 2 VIEW & CHEST 1 VIEW)  Comparison: 05/10/2012  Findings: Lungs remain predominately clear.  No pleural effusion or pneumothorax.  Cardiomediastinal contours within normal range.  No free intraperitoneal air. The bowel gas pattern is non- specific without overt obstruction as gas is noted in colon. Organ outlines are normal where seen. Surgical clip projects over the pelvis.  No acute or aggressive osseous abnormality identified.  IMPRESSION: No radiographic evidence of acute cardiopulmonary process.  Nonspecific bowel gas pattern without complete obstruction.   Original Report Authenticated By: Jearld Lesch, M.D.     1. Abdominal pain       MDM  BP 90/57  Pulse 70  Temp(Src) 97.7 F (36.5 C) (Oral)  Resp 18  SpO2 98%  I have reviewed nursing notes and vital signs. I personally reviewed the imaging tests through PACS system  I reviewed available ER/hospitalization records thought the EMR         Fayrene Helper, [place] 06/23/12 1007

## 2012-06-26 NOTE — ED Provider Notes (Signed)
Medical screening examination/treatment/procedure(s) were conducted as a shared visit with non-physician practitioner(s) and myself.  I personally evaluated the patient during the encounter   Loren Racer, MD 06/26/12 [phone]

## 2012-06-29 ENCOUNTER — Encounter (HOSPITAL_COMMUNITY): Payer: Self-pay | Admitting: Emergency Medicine

## 2012-06-29 ENCOUNTER — Emergency Department (HOSPITAL_COMMUNITY)
Admission: EM | Admit: 2012-06-29 | Discharge: 2012-06-29 | Disposition: A | Discharge status: Home / Self Care | Payer: Self-pay | Attending: Emergency Medicine | Admitting: Emergency Medicine

## 2012-06-29 ENCOUNTER — Emergency Department (HOSPITAL_COMMUNITY): Payer: Self-pay

## 2012-06-29 DIAGNOSIS — Z9889 Other specified postprocedural states: Secondary | ICD-10-CM | POA: Insufficient documentation

## 2012-06-29 DIAGNOSIS — K5900 Constipation, unspecified: Secondary | ICD-10-CM | POA: Insufficient documentation

## 2012-06-29 DIAGNOSIS — R1033 Periumbilical pain: Secondary | ICD-10-CM | POA: Insufficient documentation

## 2012-06-29 DIAGNOSIS — Z8719 Personal history of other diseases of the digestive system: Secondary | ICD-10-CM | POA: Insufficient documentation

## 2012-06-29 DIAGNOSIS — R109 Unspecified abdominal pain: Secondary | ICD-10-CM

## 2012-06-29 DIAGNOSIS — J45909 Unspecified asthma, uncomplicated: Secondary | ICD-10-CM | POA: Insufficient documentation

## 2012-06-29 LAB — CBC WITH DIFFERENTIAL/PLATELET
Basophils Absolute: 0 10*3/uL (ref 0.0–0.1)
Basophils Relative: 1 % (ref 0–1)
Eosinophils Absolute: 0.4 10*3/uL (ref 0.0–0.7)
Eosinophils Relative: 10 % — ABNORMAL HIGH (ref 0–5)
Lymphocytes Relative: 29 % (ref 12–46)
MCH: 30.2 pg (ref 26.0–34.0)
MCHC: 36.3 g/dL — ABNORMAL HIGH (ref 30.0–36.0)
MCV: 83.3 fL (ref 78.0–100.0)
Platelets: 164 10*3/uL (ref 150–400)
RDW: 13.3 % (ref 11.5–15.5)
WBC: 4.1 10*3/uL (ref 4.0–10.5)

## 2012-06-29 LAB — COMPREHENSIVE METABOLIC PANEL
ALT: 11 U/L (ref 0–53)
Albumin: 4 g/dL (ref 3.5–5.2)
Alkaline Phosphatase: 87 U/L (ref 39–117)
BUN: 10 mg/dL (ref 6–23)
Chloride: 102 mEq/L (ref 96–112)
Potassium: 3.7 mEq/L (ref 3.5–5.1)
Total Bilirubin: 0.6 mg/dL (ref 0.3–1.2)

## 2012-06-29 LAB — URINALYSIS, ROUTINE W REFLEX MICROSCOPIC
Bilirubin Urine: NEGATIVE
Glucose, UA: NEGATIVE mg/dL
Hgb urine dipstick: NEGATIVE
Ketones, ur: NEGATIVE mg/dL
Leukocytes, UA: NEGATIVE
Nitrite: NEGATIVE
Protein, ur: NEGATIVE mg/dL
Specific Gravity, Urine: 1.023 (ref 1.005–1.030)
Urobilinogen, UA: 1 mg/dL (ref 0.0–1.0)
pH: 6 (ref 5.0–8.0)

## 2012-06-29 LAB — LIPASE, BLOOD: Lipase: 29 U/L (ref 11–59)

## 2012-06-29 MED ORDER — HYDROMORPHONE HCL PF 1 MG/ML IJ SOLN
1.0000 mg | Freq: Once | INTRAMUSCULAR | Status: AC
  Administered 2012-06-29: 1 mg via INTRAVENOUS
  Filled 2012-06-29: qty 1

## 2012-06-29 MED ORDER — IOHEXOL 300 MG/ML  SOLN
100.0000 mL | Freq: Once | INTRAMUSCULAR | Status: AC | PRN
  Administered 2012-06-29: 100 mL via INTRAVENOUS

## 2012-06-29 MED ORDER — IOHEXOL 300 MG/ML  SOLN
50.0000 mL | Freq: Once | INTRAMUSCULAR | Status: AC | PRN
  Administered 2012-06-29: 50 mL via ORAL

## 2012-06-29 MED ORDER — HYDROCODONE-ACETAMINOPHEN 5-325 MG PO TABS: 1.0000 | ORAL_TABLET | ORAL | Status: DC | PRN

## 2012-06-29 NOTE — ED Notes (Signed)
PT. REPORTS MID ABDOMINAL PAIN WITH EMESIS ( X1) ONSET 12 MIDNIGHT , DENIES DIARRHEA /FEVER , PT. STATED HISTORY OF BOWEL OBSTRUCTION / BOWEL RESECTION .

## 2012-06-29 NOTE — ED Provider Notes (Signed)
History     CSN: [id number]  Arrival date & time 06/29/12  0239   First MD Initiated Contact with Patient 06/29/12 0344      Chief Complaint  Patient presents with  . Abdominal Pain    (Consider location/radiation/quality/duration/timing/severity/associated sxs/prior treatment) HPI History provided by pt and prior chart.  Pt presents w/ severe, peri-umbilical pain since 10:21pm yesterday.  Non-radiating.   Has not had a BM in 3 days. No associated fever, N/V/D, hematemesis/hematochezia/melena, urinary sx or testicular pain.  H/o SBO x 2.  He can't say whether or not this feels like his last SBO.  Per prior chart, seen for similar sx on 06/23/12, had a nml acute abd series and labs and prescribed zofran.   Past Medical History  Diagnosis Date  . Bronchitis   . Asthma   . SBO (small bowel obstruction)     Past Surgical History  Procedure Laterality Date  . Abdominal surgery      No family history on file.  History  Substance Use Topics  . Smoking status: Never Smoker   . Smokeless tobacco: Not on file  . Alcohol Use: No      Review of Systems  All other systems reviewed and are negative.    Allergies  Shellfish allergy  Home Medications   Current Outpatient Rx  Name  Route  Sig  Dispense  Refill  . ondansetron (ZOFRAN) 4 MG tablet   Oral   Take 1 tablet (4 mg total) by mouth every 6 (six) hours.   12 tablet   0     BP 115/83  Pulse 79  Temp(Src) 98.7 F (37.1 C) (Oral)  Resp 18  SpO2 98%  Physical Exam  Nursing note and vitals reviewed. Constitutional: He is oriented to person, place, and time. He appears well-developed and well-nourished. No distress.  HENT:  Head: Normocephalic and atraumatic.  Eyes:  Normal appearance  Neck: Normal range of motion.  Cardiovascular: Normal rate and regular rhythm.   Pulmonary/Chest: Effort normal and breath sounds normal. No respiratory distress.  Abdominal: Soft. Bowel sounds are normal. He exhibits no  distension and no mass. There is no rebound and no guarding.  Mid-line surgical scar.  Diffuse tenderness to light palpation w/ guarding.   Genitourinary:  No CVA tenderness  Musculoskeletal: Normal range of motion.  Neurological: He is alert and oriented to person, place, and time.  Skin: Skin is warm and dry. No rash noted.  Psychiatric: He has a normal mood and affect. His behavior is normal.    ED Course  Procedures (including critical care time)  Labs Reviewed  COMPREHENSIVE METABOLIC PANEL - Abnormal; Notable for the following:    Glucose, Bld 117 (*)    GFR calc non Af Amer 71 (*)    GFR calc Af Amer 83 (*)    All other components within normal limits  CBC WITH DIFFERENTIAL - Abnormal; Notable for the following:    MCHC 36.3 (*)    Eosinophils Relative 10 (*)    All other components within normal limits  LIPASE, BLOOD  URINALYSIS, ROUTINE W REFLEX MICROSCOPIC   Dg Abd Acute W/chest  06/29/2012   *RADIOLOGY REPORT*  Clinical Data: Abdominal pain.  ACUTE ABDOMEN SERIES (ABDOMEN 2 VIEW & CHEST 1 VIEW)  Comparison: 06/23/2012.  Findings: The upright chest x-ray is normal and stable.  Two views of the abdomen demonstrate scattered air and some stool throughout the colon and scattered air filled small bowel loops without  significant distention or air fluid levels.  No free air. The soft tissue shadows are maintained.  The bony structures are unremarkable.  IMPRESSION:  1.  No acute cardiopulmonary findings. 2.  Unremarkable abdominal bowel gas pattern.  Possible mild diffuse ileus or gastroenteritis but no findings for obstruction or perforation.   Original Report Authenticated By: Rudie Meyer, M.D.     No diagnosis found.    MDM  42yo M w/ h/o SBO presents w/ severe, peri-umbilical pain w/out associated sx since last night.  Associated w/ constipation; last BM 3 days ago.  Second visit for same this week.  On exam, afebrile, uncomfortable appearing, abd soft, non-distended,  diffusely ttp w/ guarding.  Labs unremarkable.  CT abd/pelvis ordered and pending.  Pt to receive IV dilaudid.  4:42 AM   Pt reports improvement in pain.  Neva Seat, PA-C to dispo.  6:05 AM       Otilio Miu, PA-C 06/29/12 [phone]

## 2012-06-29 NOTE — ED Provider Notes (Signed)
Patient hand off to me from [facility] awaiting abd CT/Pelv for abdominal pain. Pain is well controlled at this time. Pt has hx of multiple previous visits in the ED for similar. CT scan is negative and patient can follow-up with GI.   Rx For Norco given. Discussed briefly with Dr. Nicanor Alcon before dc.  Pt has been advised of the symptoms that warrant their return to the ED. Patient has voiced understanding and has agreed to follow-up with the PCP or specialist.   Dorthula Matas, PA-C 06/29/12 [phone]

## 2012-06-29 NOTE — ED Notes (Signed)
Pt. C/o stabbing mid abdominal pain that started last night around 2230. Pt. States was seen x3 days ago for abdominal pain but this pain is different. Hx of bowel obstruction and bowel resection x9 months ago.

## 2012-07-01 NOTE — ED Provider Notes (Signed)
Medical screening examination/treatment/procedure(s) were performed by non-physician practitioner and as supervising physician I was immediately available for consultation/collaboration.  Jasmine Awe, MD 07/01/12 [phone]

## 2012-07-01 NOTE — ED Provider Notes (Signed)
Medical screening examination/treatment/procedure(s) were performed by non-physician practitioner and as supervising physician I was immediately available for consultation/collaboration.  Jasmine Awe, MD 07/01/12 [phone]

## 2012-10-11 ENCOUNTER — Encounter (HOSPITAL_COMMUNITY): Payer: Self-pay | Admitting: Emergency Medicine

## 2012-10-11 DIAGNOSIS — K5289 Other specified noninfective gastroenteritis and colitis: Secondary | ICD-10-CM | POA: Insufficient documentation

## 2012-10-11 DIAGNOSIS — R1013 Epigastric pain: Secondary | ICD-10-CM | POA: Insufficient documentation

## 2012-10-11 DIAGNOSIS — J45909 Unspecified asthma, uncomplicated: Secondary | ICD-10-CM | POA: Insufficient documentation

## 2012-10-11 LAB — CBC WITH DIFFERENTIAL/PLATELET
Basophils Absolute: 0 10*3/uL (ref 0.0–0.1)
Basophils Relative: 0 % (ref 0–1)
Eosinophils Absolute: 0 10*3/uL (ref 0.0–0.7)
MCH: 29.8 pg (ref 26.0–34.0)
MCHC: 36 g/dL (ref 30.0–36.0)
Neutro Abs: 4.1 10*3/uL (ref 1.7–7.7)
Neutrophils Relative %: 75 % (ref 43–77)
Platelets: 145 10*3/uL — ABNORMAL LOW (ref 150–400)
RDW: 13.1 % (ref 11.5–15.5)

## 2012-10-11 LAB — COMPREHENSIVE METABOLIC PANEL
AST: 37 U/L (ref 0–37)
Albumin: 4.3 g/dL (ref 3.5–5.2)
Alkaline Phosphatase: 72 U/L (ref 39–117)
Chloride: 105 mEq/L (ref 96–112)
Potassium: 3.7 mEq/L (ref 3.5–5.1)
Total Bilirubin: 0.8 mg/dL (ref 0.3–1.2)
Total Protein: 7.6 g/dL (ref 6.0–8.3)

## 2012-10-11 LAB — LIPASE, BLOOD: Lipase: 16 U/L (ref 11–59)

## 2012-10-11 NOTE — ED Notes (Signed)
Pt. reports mid abdominal pain onset yesterday with diarrhea , denies nausea or vomitting , no fever or chills.

## 2012-10-12 ENCOUNTER — Emergency Department (HOSPITAL_COMMUNITY): Payer: Medicaid Other

## 2012-10-12 ENCOUNTER — Emergency Department (HOSPITAL_COMMUNITY)
Admission: EM | Admit: 2012-10-12 | Discharge: 2012-10-12 | Disposition: A | Discharge status: Home / Self Care | Payer: Medicaid Other | Attending: Emergency Medicine | Admitting: Emergency Medicine

## 2012-10-12 DIAGNOSIS — R109 Unspecified abdominal pain: Secondary | ICD-10-CM

## 2012-10-12 HISTORY — DX: Noninfective gastroenteritis and colitis, unspecified: K52.9

## 2012-10-12 LAB — URINALYSIS, ROUTINE W REFLEX MICROSCOPIC
Glucose, UA: NEGATIVE mg/dL
Ketones, ur: NEGATIVE mg/dL
Leukocytes, UA: NEGATIVE
Nitrite: NEGATIVE
Specific Gravity, Urine: 1.016 (ref 1.005–1.030)
pH: 7 (ref 5.0–8.0)

## 2012-10-12 MED ORDER — HYDROMORPHONE HCL PF 1 MG/ML IJ SOLN
1.0000 mg | Freq: Once | INTRAMUSCULAR | Status: AC
  Administered 2012-10-12: 1 mg via INTRAVENOUS
  Filled 2012-10-12: qty 1

## 2012-10-12 MED ORDER — SODIUM CHLORIDE 0.9 % IV BOLUS (SEPSIS)
1000.0000 mL | Freq: Once | INTRAVENOUS | Status: AC
  Administered 2012-10-12: 1000 mL via INTRAVENOUS

## 2012-10-12 MED ORDER — DICYCLOMINE HCL 20 MG PO TABS: 20.0000 mg | ORAL_TABLET | Freq: Two times a day (BID) | ORAL | Status: DC

## 2012-10-12 MED ORDER — HYDROCODONE-ACETAMINOPHEN 5-325 MG PO TABS: 1.0000 | ORAL_TABLET | Freq: Four times a day (QID) | ORAL | Status: DC | PRN

## 2012-10-12 MED ORDER — HYDROMORPHONE HCL PF 1 MG/ML IJ SOLN
INTRAMUSCULAR | Status: AC
  Filled 2012-10-12: qty 1

## 2012-10-12 MED ORDER — HYDROMORPHONE HCL PF 1 MG/ML IJ SOLN
1.0000 mg | Freq: Once | INTRAMUSCULAR | Status: AC
  Administered 2012-10-12: 1 mg via INTRAVENOUS

## 2012-10-12 NOTE — ED Provider Notes (Signed)
Medical screening examination/treatment/procedure(s) were performed by non-physician practitioner and as supervising physician I was immediately available for consultation/collaboration.  Olivia Mackie, MD 10/12/12 [phone]

## 2012-10-12 NOTE — ED Notes (Signed)
Pt reports he has had blood in his stool. MD notified.

## 2012-10-12 NOTE — ED Provider Notes (Signed)
CSN: [id number]     Arrival date & time 10/11/12  2226 History   First MD Initiated Contact with Patient 10/12/12 0122     Chief Complaint  Patient presents with  . Abdominal Pain   (Consider location/radiation/quality/duration/timing/severity/associated sxs/prior Treatment) HPI Comments: Patient presents to the ED with a chief complaint of abdominal pain.  He has a PMH remarkable for SBO.  Patient states that he was recently admitted in [place] for SBO about a month and a half ago.  He has had 2 surgeries.  He states that his pain started 2 days ago, and progressively worsened.  He endorses associated diarrhea, but no n/v.  He states that he thinks he saw some blood in his stool today. He has not tried taking anything to alleviate his symptoms.  Nothing makes his symptoms better or worse.  The history is provided by the patient. No language interpreter was used.    Past Medical History  Diagnosis Date  . Bronchitis   . Asthma   . SBO (small bowel obstruction)   . Gastroenteritis    Past Surgical History  Procedure Laterality Date  . Abdominal surgery    . Bowel resection     No family history on file. History  Substance Use Topics  . Smoking status: Never Smoker   . Smokeless tobacco: Not on file  . Alcohol Use: No    Review of Systems  All other systems reviewed and are negative.    Allergies  Shellfish allergy  Home Medications   Current Outpatient Rx  Name  Route  Sig  Dispense  Refill  . HYDROcodone-acetaminophen (NORCO/VICODIN) 5-325 MG per tablet   Oral   Take 1 tablet by mouth every 4 (four) hours as needed for pain.   15 tablet   0   . ondansetron (ZOFRAN) 4 MG tablet   Oral   Take 1 tablet (4 mg total) by mouth every 6 (six) hours.   12 tablet   0    BP 128/94  Pulse 63  Temp(Src) 98.3 F (36.8 C) (Oral)  Resp 20  SpO2 100% Physical Exam  Nursing note and vitals reviewed. Constitutional: He is oriented to person, place, and time. He appears  well-developed and well-nourished.  HENT:  Head: Normocephalic and atraumatic.  Right Ear: External ear normal.  Left Ear: External ear normal.  Nose: Nose normal.  Mouth/Throat: Oropharynx is clear and moist. No oropharyngeal exudate.  Eyes: Conjunctivae and EOM are normal. Pupils are equal, round, and reactive to light. Right eye exhibits no discharge. Left eye exhibits no discharge. No scleral icterus.  Neck: Normal range of motion. Neck supple. No JVD present.  Cardiovascular: Normal rate, regular rhythm, normal heart sounds and intact distal pulses.  Exam reveals no gallop and no friction rub.   No murmur heard. Pulmonary/Chest: Effort normal and breath sounds normal. No respiratory distress. He has no wheezes. He has no rales. He exhibits no tenderness.  Abdominal: Soft. Bowel sounds are normal. He exhibits no distension and no mass. There is tenderness. There is no rebound and no guarding.  Mid/epigastric abdominal tenderness, no fluid wave or signs of peritonitis.  Musculoskeletal: Normal range of motion. He exhibits no edema and no tenderness.  Neurological: He is alert and oriented to person, place, and time. He has normal reflexes.  CN 3-12 intact  Skin: Skin is warm and dry.  Psychiatric: He has a normal mood and affect. His behavior is normal. Judgment and thought content normal.  ED Course  Procedures (including critical care time) Labs Review Labs Reviewed  CBC WITH DIFFERENTIAL - Abnormal; Notable for the following:    Platelets 145 (*)    All other components within normal limits  COMPREHENSIVE METABOLIC PANEL - Abnormal; Notable for the following:    Glucose, Bld 106 (*)    Creatinine, Ser 1.41 (*)    GFR calc non Af Amer 60 (*)    GFR calc Af Amer 70 (*)    All other components within normal limits  LIPASE, BLOOD  URINALYSIS, ROUTINE W REFLEX MICROSCOPIC   Results for orders placed during the hospital encounter of 10/12/12  CBC WITH DIFFERENTIAL      Result  Value Range   WBC 5.5  4.0 - 10.5 K/uL   RBC 5.20  4.22 - 5.81 MIL/uL   Hemoglobin 15.5  13.0 - 17.0 g/dL   HCT 86.5  78.4 - 69.6 %   MCV 82.7  78.0 - 100.0 fL   MCH 29.8  26.0 - 34.0 pg   MCHC 36.0  30.0 - 36.0 g/dL   RDW 29.5  28.4 - 13.2 %   Platelets 145 (*) 150 - 400 K/uL   Neutrophils Relative % 75  43 - 77 %   Neutro Abs 4.1  1.7 - 7.7 K/uL   Lymphocytes Relative 17  12 - 46 %   Lymphs Abs 0.9  0.7 - 4.0 K/uL   Monocytes Relative 8  3 - 12 %   Monocytes Absolute 0.4  0.1 - 1.0 K/uL   Eosinophils Relative 1  0 - 5 %   Eosinophils Absolute 0.0  0.0 - 0.7 K/uL   Basophils Relative 0  0 - 1 %   Basophils Absolute 0.0  0.0 - 0.1 K/uL  COMPREHENSIVE METABOLIC PANEL      Result Value Range   Sodium 141  135 - 145 mEq/L   Potassium 3.7  3.5 - 5.1 mEq/L   Chloride 105  96 - 112 mEq/L   CO2 24  19 - 32 mEq/L   Glucose, Bld 106 (*) 70 - 99 mg/dL   BUN 9  6 - 23 mg/dL   Creatinine, Ser 4.40 (*) 0.50 - 1.35 mg/dL   Calcium 9.6  8.4 - 10.2 mg/dL   Total Protein 7.6  6.0 - 8.3 g/dL   Albumin 4.3  3.5 - 5.2 g/dL   AST 37  0 - 37 U/L   ALT 22  0 - 53 U/L   Alkaline Phosphatase 72  39 - 117 U/L   Total Bilirubin 0.8  0.3 - 1.2 mg/dL   GFR calc non Af Amer 60 (*) >90 mL/min   GFR calc Af Amer 70 (*) >90 mL/min  LIPASE, BLOOD      Result Value Range   Lipase 16  11 - 59 U/L  URINALYSIS, ROUTINE W REFLEX MICROSCOPIC      Result Value Range   Color, Urine YELLOW  YELLOW   APPearance CLOUDY (*) CLEAR   Specific Gravity, Urine 1.016  1.005 - 1.030   pH 7.0  5.0 - 8.0   Glucose, UA NEGATIVE  NEGATIVE mg/dL   Hgb urine dipstick NEGATIVE  NEGATIVE   Bilirubin Urine NEGATIVE  NEGATIVE   Ketones, ur NEGATIVE  NEGATIVE mg/dL   Protein, ur NEGATIVE  NEGATIVE mg/dL   Urobilinogen, UA 1.0  0.0 - 1.0 mg/dL   Nitrite NEGATIVE  NEGATIVE   Leukocytes, UA NEGATIVE  NEGATIVE  OCCULT BLOOD, POC  DEVICE      Result Value Range   Fecal Occult Bld NEGATIVE  NEGATIVE   Dg Abd Acute  W/chest  10/12/2012   *RADIOLOGY REPORT*  Clinical Data: Abdominal pain.  History of small bowel obstruction.  ACUTE ABDOMEN SERIES (ABDOMEN 2 VIEW & CHEST 1 VIEW)  Comparison: 06/29/2012.  Findings: No significant osseous abnormality.  Lungs are clear. No effusion or pneumothorax.  Cardiomediastinal size and contour are within normal limits.  No abnormal intra-abdominal mass effect or calcification. Nonobstructive bowel gas pattern.  No pneumoperitoneum.  Negative osseous structures.  IMPRESSION: 1. Nonobstructive bowel gas pattern. 2.  Clear chest.   Original Report Authenticated By: Tiburcio Pea      MDM   1. Abdominal  pain, other specified site     Patient with abdominal pain. History of SBO. Will check labs, and abdominal series.  Labs are reassuring, no evidence of SBO. Patient's pain is managed with Dilaudid. He is tolerating by mouth. He is stable and rate for discharge. Followup with PCP. Patient understands and agrees with the plan. Return precautions are given. Patient discussed with Dr. Norlene Campbell, who agrees with the plan.      Roxy Horseman, PA-C 10/12/12 [phone]

## 2012-10-25 ENCOUNTER — Emergency Department (HOSPITAL_COMMUNITY)
Admission: EM | Admit: 2012-10-25 | Discharge: 2012-10-25 | Disposition: A | Discharge status: Home / Self Care | Payer: Medicaid Other | Attending: Emergency Medicine | Admitting: Emergency Medicine

## 2012-10-25 ENCOUNTER — Encounter (HOSPITAL_COMMUNITY): Payer: Self-pay | Admitting: Emergency Medicine

## 2012-10-25 ENCOUNTER — Emergency Department (HOSPITAL_COMMUNITY): Payer: Medicaid Other

## 2012-10-25 DIAGNOSIS — R110 Nausea: Secondary | ICD-10-CM | POA: Insufficient documentation

## 2012-10-25 DIAGNOSIS — R1033 Periumbilical pain: Secondary | ICD-10-CM | POA: Insufficient documentation

## 2012-10-25 DIAGNOSIS — J45909 Unspecified asthma, uncomplicated: Secondary | ICD-10-CM | POA: Insufficient documentation

## 2012-10-25 DIAGNOSIS — Z8719 Personal history of other diseases of the digestive system: Secondary | ICD-10-CM | POA: Insufficient documentation

## 2012-10-25 DIAGNOSIS — Z79899 Other long term (current) drug therapy: Secondary | ICD-10-CM | POA: Insufficient documentation

## 2012-10-25 DIAGNOSIS — R109 Unspecified abdominal pain: Secondary | ICD-10-CM

## 2012-10-25 LAB — COMPREHENSIVE METABOLIC PANEL
ALT: 20 U/L (ref 0–53)
AST: 24 U/L (ref 0–37)
Alkaline Phosphatase: 78 U/L (ref 39–117)
CO2: 28 mEq/L (ref 19–32)
Chloride: 100 mEq/L (ref 96–112)
GFR calc Af Amer: 66 mL/min — ABNORMAL LOW (ref >90)
GFR calc non Af Amer: 57 mL/min — ABNORMAL LOW (ref >90)
Glucose, Bld: 107 mg/dL — ABNORMAL HIGH (ref 70–99)
Potassium: 3.7 mEq/L (ref 3.5–5.1)
Sodium: 138 mEq/L (ref 135–145)
Total Bilirubin: 1.4 mg/dL — ABNORMAL HIGH (ref 0.3–1.2)

## 2012-10-25 LAB — CBC
Hemoglobin: 15.1 g/dL (ref 13.0–17.0)
MCH: 30 pg (ref 26.0–34.0)
Platelets: 150 10*3/uL (ref 150–400)
RBC: 5.04 MIL/uL (ref 4.22–5.81)
WBC: 6.5 10*3/uL (ref 4.0–10.5)

## 2012-10-25 MED ORDER — SODIUM CHLORIDE 0.9 % IV SOLN
1000.0000 mL | INTRAVENOUS | Status: DC
  Administered 2012-10-25: 1000 mL via INTRAVENOUS

## 2012-10-25 MED ORDER — SODIUM CHLORIDE 0.9 % IV SOLN
1000.0000 mL | Freq: Once | INTRAVENOUS | Status: AC
  Administered 2012-10-25: 1000 mL via INTRAVENOUS

## 2012-10-25 MED ORDER — MORPHINE SULFATE 4 MG/ML IJ SOLN
4.0000 mg | Freq: Once | INTRAMUSCULAR | Status: AC
  Administered 2012-10-25: 4 mg via INTRAVENOUS
  Filled 2012-10-25: qty 1

## 2012-10-25 MED ORDER — ONDANSETRON HCL 4 MG/2ML IJ SOLN
4.0000 mg | Freq: Once | INTRAMUSCULAR | Status: AC
  Administered 2012-10-25: 4 mg via INTRAVENOUS
  Filled 2012-10-25: qty 2

## 2012-10-25 NOTE — ED Notes (Addendum)
Pt. Driving. Told he needs to find a ride or wait at least 4 hours before driving since given morphine at 0450. Pt. Verbalized understanding and states he will wait. Per charge, patient can wait in waiting room. Currently alert and oriented x4.

## 2012-10-25 NOTE — ED Notes (Signed)
Pt. reports low abdominal pain with nausea and constipation  onset yesterday .

## 2012-10-25 NOTE — ED Notes (Signed)
Pt reports abdominal pain for the past two weeks, pt states he has a hx of bowel obstruction. Pt states he was seen two weeks ago for abdominal pain and his visit was inconclusive. Pt reports an increase in pain last night at 2230 that has not let up. Pt states he is very uncomfortable. Pt states he has not had a bowel movement in two days. Pt states he is nauseous but that he has not vomited.

## 2012-10-25 NOTE — ED Provider Notes (Signed)
CSN: [id number]     Arrival date & time 10/25/12  0250 History   First MD Initiated Contact with Patient 10/25/12 0404     Chief Complaint  Patient presents with  . Abdominal Pain   (Consider location/radiation/quality/duration/timing/severity/associated sxs/prior Treatment) Patient is a 42 y.o. male presenting with abdominal pain. The history is provided by the patient.  Abdominal Pain He had onset at about 10:30 of severe periumbilical pain without radiation. He rates pain at 8/10. There is associated nausea but no vomiting. He states that he has not had a bowel movement in the last 2 days but has been passing flatus. Passing flatus does not affect the pain. He denies fever, chills, sweats. He has had similar pains before. He has a history of bowel resection and small bowel obstruction.  Past Medical History  Diagnosis Date  . Bronchitis   . Asthma   . SBO (small bowel obstruction)   . Gastroenteritis    Past Surgical History  Procedure Laterality Date  . Abdominal surgery    . Bowel resection     No family history on file. History  Substance Use Topics  . Smoking status: Never Smoker   . Smokeless tobacco: Not on file  . Alcohol Use: No    Review of Systems  Gastrointestinal: Positive for abdominal pain.  All other systems reviewed and are negative.    Allergies  Shellfish allergy  Home Medications   Current Outpatient Rx  Name  Route  Sig  Dispense  Refill  . dicyclomine (BENTYL) 20 MG tablet   Oral   Take 1 tablet (20 mg total) by mouth 2 (two) times daily.   20 tablet   0   . HYDROcodone-acetaminophen (NORCO/VICODIN) 5-325 MG per tablet   Oral   Take 1 tablet by mouth every 6 (six) hours as needed for pain.   13 tablet   0    BP 128/87  Pulse 86  Temp(Src) 98.2 F (36.8 C) (Oral)  Resp 20  SpO2 99% Physical Exam  Nursing note and vitals reviewed.  42 year old male, who is curled in a fetal position and appears uncomfortable, but is in no acute  distress. Vital signs are normal. Oxygen saturation is 99%, which is normal. Head is normocephalic and atraumatic. PERRLA, EOMI. Oropharynx is clear. Neck is nontender and supple without adenopathy or JVD. Back is nontender and there is no CVA tenderness. Lungs are clear without rales, wheezes, or rhonchi. Chest is nontender. Heart has regular rate and rhythm without murmur. Abdomen as midline scar present. Abdomen is soft with moderate periumbilical tenderness. There is no rebound or guarding. There are no masses or hepatosplenomegaly. Peristalsis is hypoactive but present. Extremities have no cyanosis or edema, full range of motion is present. Skin is warm and dry without rash. Neurologic: Mental status is normal, cranial nerves are intact, there are no motor or sensory deficits.  ED Course  Procedures (including critical care time) Labs Review Results for orders placed during the hospital encounter of 10/25/12  CBC      Result Value Range   WBC 6.5  4.0 - 10.5 K/uL   RBC 5.04  4.22 - 5.81 MIL/uL   Hemoglobin 15.1  13.0 - 17.0 g/dL   HCT 78.2  95.6 - 21.3 %   MCV 81.5  78.0 - 100.0 fL   MCH 30.0  26.0 - 34.0 pg   MCHC 36.7 (*) 30.0 - 36.0 g/dL   RDW 08.6  57.8 - 46.9 %  Platelets 150  150 - 400 K/uL  COMPREHENSIVE METABOLIC PANEL      Result Value Range   Sodium 138  135 - 145 mEq/L   Potassium 3.7  3.5 - 5.1 mEq/L   Chloride 100  96 - 112 mEq/L   CO2 28  19 - 32 mEq/L   Glucose, Bld 107 (*) 70 - 99 mg/dL   BUN 14  6 - 23 mg/dL   Creatinine, Ser 1.61 (*) 0.50 - 1.35 mg/dL   Calcium 9.6  8.4 - 09.6 mg/dL   Total Protein 7.5  6.0 - 8.3 g/dL   Albumin 4.3  3.5 - 5.2 g/dL   AST 24  0 - 37 U/L   ALT 20  0 - 53 U/L   Alkaline Phosphatase 78  39 - 117 U/L   Total Bilirubin 1.4 (*) 0.3 - 1.2 mg/dL   GFR calc non Af Amer 57 (*) >90 mL/min   GFR calc Af Amer 66 (*) >90 mL/min   Imaging Review Dg Abd Acute W/chest  10/25/2012   CLINICAL DATA:  Abdominal pain and nausea.  EXAM:  ACUTE ABDOMEN SERIES (ABDOMEN 2 VIEW & CHEST 1 VIEW)  COMPARISON:  10/12/2012.  FINDINGS: There is no evidence of dilated bowel loops or free intraperitoneal air. No radiopaque calculi or other significant radiographic abnormality is seen. There is a surgical clip in the right lower abdomen. Heart size and mediastinal contours are within normal limits. Both lungs are clear.  IMPRESSION: Negative abdominal radiographs.  No acute cardiopulmonary disease.   Electronically Signed   By: Tiburcio Pea   On: 10/25/2012 04:47    MDM   1. Abdominal pain    Abdominal pain. Old records are reviewed and he has multiple ED visits with similar presentations and x-rays have always failed to show any evidence of obstruction. 1 CT scan also failed to show evidence of obstruction. Patient is asking for intravenous Dilaudid stating the last time he was here he got that every 2 hours. Am concerned about drug-seeking behavior and [place] only found to narcotic prescriptions the last 4 months.  5:14 AM Patient is noted to be sleeping soundly after a single dose of morphine 4 mg. Workup is unremarkable and he will be discharged.    Dione Booze, MD 10/25/12 [phone]

## 2012-11-27 ENCOUNTER — Encounter (HOSPITAL_COMMUNITY): Payer: Self-pay | Admitting: Emergency Medicine

## 2012-11-27 ENCOUNTER — Emergency Department (HOSPITAL_COMMUNITY): Payer: Medicaid Other

## 2012-11-27 ENCOUNTER — Emergency Department (HOSPITAL_COMMUNITY)
Admission: EM | Admit: 2012-11-27 | Discharge: 2012-11-27 | Disposition: A | Discharge status: Home / Self Care | Payer: Medicaid Other | Attending: Emergency Medicine | Admitting: Emergency Medicine

## 2012-11-27 DIAGNOSIS — Z79899 Other long term (current) drug therapy: Secondary | ICD-10-CM | POA: Insufficient documentation

## 2012-11-27 DIAGNOSIS — R112 Nausea with vomiting, unspecified: Secondary | ICD-10-CM | POA: Insufficient documentation

## 2012-11-27 DIAGNOSIS — Z8709 Personal history of other diseases of the respiratory system: Secondary | ICD-10-CM | POA: Insufficient documentation

## 2012-11-27 DIAGNOSIS — J45909 Unspecified asthma, uncomplicated: Secondary | ICD-10-CM | POA: Insufficient documentation

## 2012-11-27 DIAGNOSIS — Z8719 Personal history of other diseases of the digestive system: Secondary | ICD-10-CM | POA: Insufficient documentation

## 2012-11-27 DIAGNOSIS — R109 Unspecified abdominal pain: Secondary | ICD-10-CM

## 2012-11-27 DIAGNOSIS — R1084 Generalized abdominal pain: Secondary | ICD-10-CM | POA: Insufficient documentation

## 2012-11-27 DIAGNOSIS — K5900 Constipation, unspecified: Secondary | ICD-10-CM | POA: Insufficient documentation

## 2012-11-27 LAB — CBC WITH DIFFERENTIAL/PLATELET
Basophils Relative: 0 % (ref 0–1)
Eosinophils Relative: 0 % (ref 0–5)
HCT: 48.7 % (ref 39.0–52.0)
Hemoglobin: 17.4 g/dL — ABNORMAL HIGH (ref 13.0–17.0)
Lymphocytes Relative: 5 % — ABNORMAL LOW (ref 12–46)
Lymphs Abs: 0.4 10*3/uL — ABNORMAL LOW (ref 0.7–4.0)
MCH: 29.6 pg (ref 26.0–34.0)
MCV: 83 fL (ref 78.0–100.0)
Monocytes Absolute: 0.4 10*3/uL (ref 0.1–1.0)
Monocytes Relative: 5 % (ref 3–12)
Neutro Abs: 6.4 10*3/uL (ref 1.7–7.7)
RDW: 13 % (ref 11.5–15.5)
WBC: 7.1 10*3/uL (ref 4.0–10.5)

## 2012-11-27 LAB — COMPREHENSIVE METABOLIC PANEL
ALT: 17 U/L (ref 0–53)
Albumin: 4.5 g/dL (ref 3.5–5.2)
Alkaline Phosphatase: 99 U/L (ref 39–117)
BUN: 11 mg/dL (ref 6–23)
CO2: 27 mEq/L (ref 19–32)
Calcium: 9.8 mg/dL (ref 8.4–10.5)
Chloride: 103 mEq/L (ref 96–112)
Creatinine, Ser: 1.16 mg/dL (ref 0.50–1.35)
GFR calc Af Amer: 88 mL/min — ABNORMAL LOW (ref >90)
GFR calc non Af Amer: 76 mL/min — ABNORMAL LOW (ref >90)
Glucose, Bld: 101 mg/dL — ABNORMAL HIGH (ref 70–99)
Potassium: 4 mEq/L (ref 3.5–5.1)
Total Bilirubin: 0.9 mg/dL (ref 0.3–1.2)

## 2012-11-27 MED ORDER — IBUPROFEN 600 MG PO TABS: 600.0000 mg | ORAL_TABLET | Freq: Four times a day (QID) | ORAL | Status: DC | PRN

## 2012-11-27 MED ORDER — HYDROCODONE-ACETAMINOPHEN 5-325 MG PO TABS: 1.0000 | ORAL_TABLET | Freq: Four times a day (QID) | ORAL | Status: DC | PRN

## 2012-11-27 MED ORDER — SODIUM CHLORIDE 0.9 % IV BOLUS (SEPSIS)
1000.0000 mL | Freq: Once | INTRAVENOUS | Status: AC
  Administered 2012-11-27: 1000 mL via INTRAVENOUS

## 2012-11-27 MED ORDER — ONDANSETRON HCL 4 MG/2ML IJ SOLN
4.0000 mg | Freq: Once | INTRAMUSCULAR | Status: AC
  Administered 2012-11-27: 4 mg via INTRAVENOUS
  Filled 2012-11-27: qty 2

## 2012-11-27 MED ORDER — MORPHINE SULFATE 4 MG/ML IJ SOLN
4.0000 mg | Freq: Once | INTRAMUSCULAR | Status: AC
  Administered 2012-11-27: 4 mg via INTRAVENOUS
  Filled 2012-11-27: qty 1

## 2012-11-27 NOTE — ED Notes (Signed)
Pt reports lower abd pain starting last night, feels the need to have a BM but unable to, pt reports vomited x6 this am, pt denies passing gas as well, pt has a hx of SBO x2 2 years ago

## 2012-11-27 NOTE — ED Notes (Signed)
Per family at bedside pt has been having cold sweats and possible fever, oral temp 98.0 in triage

## 2012-11-27 NOTE — ED Notes (Signed)
Pt requesting for blood work to be performed when he gets to a room

## 2012-11-27 NOTE — ED Provider Notes (Addendum)
CSN: [id number]     Arrival date & time 11/27/12  1123 History   First MD Initiated Contact with Patient 11/27/12 1421     Chief Complaint  Patient presents with  . Abdominal Pain   (Consider location/radiation/quality/duration/timing/severity/associated sxs/prior Treatment) HPI Comments: Pt comes in with cc of abd pain. Pt has hx of SBO, and states that he started having abd pain last night, diffuse, and with some nausea and emesis (x6). Emesis is non bilious and non bloody. He states that his last BM was 2 days ago, and doesn't think he is passing flatus.  Pt has been seen in the ED with abd pain before. He has had AAS and CT, all neg. States that his SBO were in [place] and [place], when he reqd surgical intervention.   Patient is a 42 y.o. male presenting with abdominal pain. The history is provided by the patient.  Abdominal Pain Associated symptoms: constipation, nausea and vomiting   Associated symptoms: no chest pain, no cough, no dysuria and no shortness of breath     Past Medical History  Diagnosis Date  . Bronchitis   . Asthma   . SBO (small bowel obstruction)   . Gastroenteritis    Past Surgical History  Procedure Laterality Date  . Abdominal surgery    . Bowel resection     History reviewed. No pertinent family history. History  Substance Use Topics  . Smoking status: Never Smoker   . Smokeless tobacco: Not on file  . Alcohol Use: No    Review of Systems  Constitutional: Negative for activity change and appetite change.  Respiratory: Negative for cough and shortness of breath.   Cardiovascular: Negative for chest pain.  Gastrointestinal: Positive for nausea, vomiting, abdominal pain and constipation.  Genitourinary: Negative for dysuria.    Allergies  Shellfish allergy  Home Medications   Current Outpatient Rx  Name  Route  Sig  Dispense  Refill  . dicyclomine (BENTYL) 20 MG tablet   Oral   Take 1 tablet (20 mg total) by mouth 2 (two) times  daily.   20 tablet   0   . HYDROcodone-acetaminophen (NORCO/VICODIN) 5-325 MG per tablet   Oral   Take 1 tablet by mouth every 6 (six) hours as needed for pain.   13 tablet   0   . Multiple Vitamin (MULTIVITAMIN WITH MINERALS) TABS tablet   Oral   Take 1 tablet by mouth daily.          BP 129/78  Pulse 101  Temp(Src) 98 F (36.7 C) (Oral)  Resp 20  SpO2 97% Physical Exam  Nursing note and vitals reviewed. Constitutional: He is oriented to person, place, and time. He appears well-developed.  HENT:  Head: Normocephalic and atraumatic.  Eyes: Conjunctivae and EOM are normal. Pupils are equal, round, and reactive to light.  Neck: Normal range of motion. Neck supple.  Cardiovascular: Normal rate and regular rhythm.   Pulmonary/Chest: Effort normal and breath sounds normal.  Abdominal: Soft. Bowel sounds are normal. He exhibits no distension. There is tenderness. There is no rebound and no guarding.  Diffuse tenderness, with left sided guarding  Neurological: He is alert and oriented to person, place, and time.  Skin: Skin is warm.    ED Course  Procedures (including critical care time) Labs Review Labs Reviewed  CBC WITH DIFFERENTIAL  COMPREHENSIVE METABOLIC PANEL  LIPASE, BLOOD  URINALYSIS, ROUTINE W REFLEX MICROSCOPIC   Imaging Review No results found.  EKG Interpretation  None       MDM  No diagnosis found.  DDx includes: Pancreatitis Hepatobiliary pathology including cholecystitis Gastritis/PUD SBO Intra abdominal abscess Thrombosis Mesenteric ischemia Diverticulitis Peritonitis Appendicitis Hernia Nephrolithiasis Pyelonephritis UTI/Cystitis  Pt comes in with cc of abd pai. Hx of SBO, with surgical incision scar on the abd - which just means he had some abd surgery. Initial clinical impression, if the hx is accurate, that patient has SBO/ileus. We will start with AAS and hydration and basic labs including lactate.  Derwood Kaplan,  MD 11/27/12 1520  4:15 PM Pt's labs are wnl. No elye abn, no profound dehydration, and AAS shoes no aif fluid levels, or other signs of infection. Will give nsaids for pain. No narcotics for d.c  Derwood Kaplan, MD 11/27/12 [phone]

## 2012-12-05 ENCOUNTER — Inpatient Hospital Stay (HOSPITAL_COMMUNITY)
Admission: EM | Admit: 2012-12-05 | Discharge: 2012-12-19 | DRG: 336 | Disposition: A | Discharge status: Home / Self Care | Payer: Medicaid Other | Attending: Surgery | Admitting: Surgery

## 2012-12-05 ENCOUNTER — Emergency Department (HOSPITAL_COMMUNITY)
Admission: EM | Admit: 2012-12-05 | Discharge: 2012-12-05 | Discharge status: Absent without leave | Payer: Medicaid Other | Source: Home / Self Care | Attending: Emergency Medicine | Admitting: Emergency Medicine

## 2012-12-05 ENCOUNTER — Emergency Department (HOSPITAL_COMMUNITY): Payer: Medicaid Other

## 2012-12-05 ENCOUNTER — Encounter (HOSPITAL_COMMUNITY): Payer: Self-pay | Admitting: Emergency Medicine

## 2012-12-05 ENCOUNTER — Inpatient Hospital Stay (HOSPITAL_COMMUNITY): Payer: Medicaid Other

## 2012-12-05 DIAGNOSIS — K56609 Unspecified intestinal obstruction, unspecified as to partial versus complete obstruction: Secondary | ICD-10-CM

## 2012-12-05 DIAGNOSIS — Z6825 Body mass index (BMI) 25.0-25.9, adult: Secondary | ICD-10-CM

## 2012-12-05 DIAGNOSIS — Z79899 Other long term (current) drug therapy: Secondary | ICD-10-CM

## 2012-12-05 DIAGNOSIS — K660 Peritoneal adhesions (postprocedural) (postinfection): Secondary | ICD-10-CM | POA: Diagnosis present

## 2012-12-05 DIAGNOSIS — K5909 Other constipation: Secondary | ICD-10-CM | POA: Diagnosis present

## 2012-12-05 DIAGNOSIS — K560 Paralytic ileus: Secondary | ICD-10-CM | POA: Diagnosis not present

## 2012-12-05 DIAGNOSIS — K929 Disease of digestive system, unspecified: Secondary | ICD-10-CM | POA: Diagnosis not present

## 2012-12-05 DIAGNOSIS — R109 Unspecified abdominal pain: Secondary | ICD-10-CM

## 2012-12-05 DIAGNOSIS — K567 Ileus, unspecified: Secondary | ICD-10-CM | POA: Clinically undetermined

## 2012-12-05 DIAGNOSIS — Z23 Encounter for immunization: Secondary | ICD-10-CM

## 2012-12-05 DIAGNOSIS — K450 Other specified abdominal hernia with obstruction, without gangrene: Principal | ICD-10-CM | POA: Diagnosis present

## 2012-12-05 DIAGNOSIS — K5900 Constipation, unspecified: Secondary | ICD-10-CM

## 2012-12-05 DIAGNOSIS — E46 Unspecified protein-calorie malnutrition: Secondary | ICD-10-CM | POA: Diagnosis present

## 2012-12-05 DIAGNOSIS — Z9049 Acquired absence of other specified parts of digestive tract: Secondary | ICD-10-CM

## 2012-12-05 DIAGNOSIS — Y838 Other surgical procedures as the cause of abnormal reaction of the patient, or of later complication, without mention of misadventure at the time of the procedure: Secondary | ICD-10-CM | POA: Diagnosis not present

## 2012-12-05 DIAGNOSIS — J45909 Unspecified asthma, uncomplicated: Secondary | ICD-10-CM | POA: Diagnosis present

## 2012-12-05 DIAGNOSIS — G8929 Other chronic pain: Secondary | ICD-10-CM | POA: Diagnosis present

## 2012-12-05 HISTORY — DX: Other postprocedural complications and disorders of digestive system: K91.89

## 2012-12-05 HISTORY — DX: Ileus, unspecified: K56.7

## 2012-12-05 LAB — COMPREHENSIVE METABOLIC PANEL
ALT: 14 U/L (ref 0–53)
AST: 19 U/L (ref 0–37)
Alkaline Phosphatase: 76 U/L (ref 39–117)
CO2: 30 mEq/L (ref 19–32)
CO2: 26 mEq/L (ref 19–32)
Calcium: 9.5 mg/dL (ref 8.4–10.5)
Calcium: 8.8 mg/dL (ref 8.4–10.5)
Chloride: 102 mEq/L (ref 96–112)
Chloride: 103 mEq/L (ref 96–112)
Creatinine, Ser: 1.33 mg/dL (ref 0.50–1.35)
GFR calc Af Amer: 74 mL/min — ABNORMAL LOW (ref >90)
GFR calc non Af Amer: 64 mL/min — ABNORMAL LOW (ref >90)
GFR calc non Af Amer: 81 mL/min — ABNORMAL LOW (ref >90)
Glucose, Bld: 112 mg/dL — ABNORMAL HIGH (ref 70–99)
Glucose, Bld: 92 mg/dL (ref 70–99)
Potassium: 4.1 mEq/L (ref 3.5–5.1)
Sodium: 138 mEq/L (ref 135–145)
Total Bilirubin: 0.6 mg/dL (ref 0.3–1.2)
Total Protein: 7 g/dL (ref 6.0–8.3)

## 2012-12-05 LAB — CBC WITH DIFFERENTIAL/PLATELET
Basophils Absolute: 0 10*3/uL (ref 0.0–0.1)
Basophils Absolute: 0 10*3/uL (ref 0.0–0.1)
Basophils Relative: 0 % (ref 0–1)
Eosinophils Absolute: 0.2 10*3/uL (ref 0.0–0.7)
Eosinophils Absolute: 0.1 10*3/uL (ref 0.0–0.7)
Eosinophils Relative: 2 % (ref 0–5)
Eosinophils Relative: 1 % (ref 0–5)
HCT: 42.7 % (ref 39.0–52.0)
Hemoglobin: 15.5 g/dL (ref 13.0–17.0)
Lymphocytes Relative: 11 % — ABNORMAL LOW (ref 12–46)
Lymphocytes Relative: 10 % — ABNORMAL LOW (ref 12–46)
Lymphs Abs: 0.9 10*3/uL (ref 0.7–4.0)
Lymphs Abs: 1 10*3/uL (ref 0.7–4.0)
MCH: 29.9 pg (ref 26.0–34.0)
MCHC: 35.7 g/dL (ref 30.0–36.0)
MCV: 82.4 fL (ref 78.0–100.0)
MCV: 82.8 fL (ref 78.0–100.0)
Monocytes Absolute: 0.4 10*3/uL (ref 0.1–1.0)
Monocytes Relative: 4 % (ref 3–12)
Neutro Abs: 6.6 10*3/uL (ref 1.7–7.7)
Neutrophils Relative %: 84 % — ABNORMAL HIGH (ref 43–77)
Platelets: 156 10*3/uL (ref 150–400)
RBC: 5.18 MIL/uL (ref 4.22–5.81)
RBC: 5.11 MIL/uL (ref 4.22–5.81)
RDW: 12.7 % (ref 11.5–15.5)
RDW: 12.8 % (ref 11.5–15.5)
WBC: 8.1 10*3/uL (ref 4.0–10.5)
WBC: 9.7 10*3/uL (ref 4.0–10.5)

## 2012-12-05 LAB — URINALYSIS, ROUTINE W REFLEX MICROSCOPIC
Bilirubin Urine: NEGATIVE
Glucose, UA: NEGATIVE mg/dL
Hgb urine dipstick: NEGATIVE
Ketones, ur: NEGATIVE mg/dL
Nitrite: NEGATIVE
Protein, ur: NEGATIVE mg/dL
Specific Gravity, Urine: 1.022 (ref 1.005–1.030)

## 2012-12-05 LAB — CG4 I-STAT (LACTIC ACID): Lactic Acid, Venous: 1.67 mmol/L (ref 0.5–2.2)

## 2012-12-05 MED ORDER — HYDROMORPHONE HCL PF 1 MG/ML IJ SOLN
0.5000 mg | INTRAMUSCULAR | Status: DC | PRN
  Administered 2012-12-06 – 2012-12-07 (×7): 1 mg via INTRAVENOUS
  Administered 2012-12-07: 2 mg via INTRAVENOUS
  Administered 2012-12-07 (×2): 1 mg via INTRAVENOUS
  Administered 2012-12-08 (×3): 2 mg via INTRAVENOUS
  Administered 2012-12-08: 1 mg via INTRAVENOUS
  Administered 2012-12-08: 2 mg via INTRAVENOUS
  Administered 2012-12-08: 1 mg via INTRAVENOUS
  Administered 2012-12-08 – 2012-12-10 (×4): 2 mg via INTRAVENOUS
  Filled 2012-12-05 (×2): qty 2
  Filled 2012-12-05: qty 1
  Filled 2012-12-05 (×4): qty 2
  Filled 2012-12-05 (×5): qty 1
  Filled 2012-12-05: qty 2
  Filled 2012-12-05 (×2): qty 1
  Filled 2012-12-05 (×2): qty 2
  Filled 2012-12-05 (×3): qty 1

## 2012-12-05 MED ORDER — HYDROMORPHONE HCL PF 1 MG/ML IJ SOLN
1.0000 mg | Freq: Once | INTRAMUSCULAR | Status: AC
  Administered 2012-12-05: 1 mg via INTRAVENOUS
  Filled 2012-12-05: qty 1

## 2012-12-05 MED ORDER — SODIUM CHLORIDE 0.9 % IV BOLUS (SEPSIS)
1000.0000 mL | Freq: Once | INTRAVENOUS | Status: AC
  Administered 2012-12-05: 1000 mL via INTRAVENOUS

## 2012-12-05 MED ORDER — KETOROLAC TROMETHAMINE 15 MG/ML IJ SOLN
15.0000 mg | Freq: Four times a day (QID) | INTRAMUSCULAR | Status: DC | PRN
Start: 2012-12-05 — End: 2012-12-09
  Administered 2012-12-05: 30 mg via INTRAVENOUS
  Administered 2012-12-06 (×2): 15 mg via INTRAVENOUS
  Administered 2012-12-06: 30 mg via INTRAVENOUS
  Administered 2012-12-07: 15 mg via INTRAVENOUS
  Administered 2012-12-07: 30 mg via INTRAVENOUS
  Administered 2012-12-08: 15 mg via INTRAVENOUS
  Administered 2012-12-08: 30 mg via INTRAVENOUS
  Filled 2012-12-05: qty 2
  Filled 2012-12-05: qty 1
  Filled 2012-12-05 (×5): qty 2
  Filled 2012-12-05: qty 1
  Filled 2012-12-05: qty 2

## 2012-12-05 MED ORDER — PHENOL 1.4 % MT LIQD
2.0000 | OROMUCOSAL | Status: DC | PRN
  Administered 2012-12-11: 2 via OROMUCOSAL
  Filled 2012-12-05: qty 177

## 2012-12-05 MED ORDER — LACTATED RINGERS IV BOLUS (SEPSIS): 1000.0000 mL | Freq: Three times a day (TID) | INTRAVENOUS | Status: AC | PRN

## 2012-12-05 MED ORDER — HYDROMORPHONE HCL PF 1 MG/ML IJ SOLN
1.0000 mg | Freq: Once | INTRAMUSCULAR | Status: DC
  Administered 2012-12-05: 1 mg via INTRAVENOUS
  Filled 2012-12-05: qty 1

## 2012-12-05 MED ORDER — LIDOCAINE VISCOUS 2 % MT SOLN
15.0000 mL | Freq: Once | OROMUCOSAL | Status: DC
  Filled 2012-12-05: qty 15

## 2012-12-05 MED ORDER — HYDROMORPHONE HCL PF 1 MG/ML IJ SOLN
1.0000 mg | Freq: Once | INTRAMUSCULAR | Status: AC
  Administered 2012-12-05: 1 mg via INTRAVENOUS

## 2012-12-05 MED ORDER — ONDANSETRON HCL 4 MG/2ML IJ SOLN
4.0000 mg | Freq: Once | INTRAMUSCULAR | Status: DC
  Administered 2012-12-05: 4 mg via INTRAVENOUS
  Filled 2012-12-05: qty 2

## 2012-12-05 MED ORDER — ONDANSETRON HCL 4 MG/2ML IJ SOLN
4.0000 mg | Freq: Four times a day (QID) | INTRAMUSCULAR | Status: DC | PRN
  Administered 2012-12-06 – 2012-12-18 (×3): 4 mg via INTRAVENOUS
  Filled 2012-12-05 (×3): qty 2

## 2012-12-05 MED ORDER — IOHEXOL 300 MG/ML  SOLN
50.0000 mL | Freq: Once | INTRAMUSCULAR | Status: AC | PRN
  Administered 2012-12-05: 50 mL via INTRAVENOUS

## 2012-12-05 MED ORDER — ONDANSETRON HCL 4 MG/2ML IJ SOLN
4.0000 mg | Freq: Once | INTRAMUSCULAR | Status: AC
  Administered 2012-12-05: 4 mg via INTRAVENOUS
  Filled 2012-12-05: qty 2

## 2012-12-05 MED ORDER — HEPARIN SODIUM (PORCINE) 5000 UNIT/ML IJ SOLN
5000.0000 [IU] | Freq: Three times a day (TID) | INTRAMUSCULAR | Status: DC
  Administered 2012-12-05 – 2012-12-07 (×5): 5000 [IU] via SUBCUTANEOUS
  Filled 2012-12-05 (×8): qty 1

## 2012-12-05 MED ORDER — LIDOCAINE HCL 2 % EX GEL
CUTANEOUS | Status: AC
  Filled 2012-12-05: qty 10

## 2012-12-05 MED ORDER — BISACODYL 10 MG RE SUPP: 10.0000 mg | Freq: Two times a day (BID) | RECTAL | Status: DC | PRN

## 2012-12-05 MED ORDER — METOPROLOL TARTRATE 1 MG/ML IV SOLN
5.0000 mg | Freq: Four times a day (QID) | INTRAVENOUS | Status: DC | PRN
  Filled 2012-12-05: qty 5

## 2012-12-05 MED ORDER — ONDANSETRON HCL 4 MG/2ML IJ SOLN: 4.0000 mg | Freq: Once | INTRAMUSCULAR | Status: DC

## 2012-12-05 MED ORDER — IOHEXOL 300 MG/ML  SOLN
100.0000 mL | Freq: Once | INTRAMUSCULAR | Status: AC | PRN
  Administered 2012-12-05: 100 mL via INTRAVENOUS

## 2012-12-05 MED ORDER — KCL IN DEXTROSE-NACL 40-5-0.45 MEQ/L-%-% IV SOLN
INTRAVENOUS | Status: DC
  Administered 2012-12-05 – 2012-12-06 (×4): via INTRAVENOUS
  Administered 2012-12-07: 125 mL/h via INTRAVENOUS
  Administered 2012-12-07 – 2012-12-11 (×9): via INTRAVENOUS
  Filled 2012-12-05 (×20): qty 1000

## 2012-12-05 MED ORDER — LIP MEDEX EX OINT
1.0000 "application " | TOPICAL_OINTMENT | Freq: Two times a day (BID) | CUTANEOUS | Status: DC
  Administered 2012-12-05 – 2012-12-18 (×27): 1 via TOPICAL
  Filled 2012-12-05: qty 7

## 2012-12-05 MED ORDER — PROMETHAZINE HCL 25 MG/ML IJ SOLN
12.5000 mg | Freq: Four times a day (QID) | INTRAMUSCULAR | Status: DC | PRN
Start: 2012-12-05 — End: 2012-12-08
  Administered 2012-12-05: 12.5 mg via INTRAVENOUS
  Filled 2012-12-05 (×2): qty 1

## 2012-12-05 MED ORDER — DIPHENHYDRAMINE HCL 50 MG/ML IJ SOLN: 12.5000 mg | Freq: Four times a day (QID) | INTRAMUSCULAR | Status: DC | PRN

## 2012-12-05 MED ORDER — SODIUM CHLORIDE 0.9 % IV BOLUS (SEPSIS)
1000.0000 mL | Freq: Once | INTRAVENOUS | Status: DC
Start: 2012-12-05 — End: 2012-12-05
  Administered 2012-12-05: 1000 mL via INTRAVENOUS

## 2012-12-05 MED ORDER — SODIUM CHLORIDE 0.9 % IV BOLUS (SEPSIS): 1000.0000 mL | Freq: Once | INTRAVENOUS | Status: DC

## 2012-12-05 MED ORDER — ACETAMINOPHEN 650 MG RE SUPP: 650.0000 mg | Freq: Four times a day (QID) | RECTAL | Status: DC | PRN

## 2012-12-05 MED ORDER — ALUM & MAG HYDROXIDE-SIMETH 200-200-20 MG/5ML PO SUSP: 30.0000 mL | Freq: Four times a day (QID) | ORAL | Status: DC | PRN

## 2012-12-05 MED ORDER — MENTHOL 3 MG MT LOZG
1.0000 | LOZENGE | OROMUCOSAL | Status: DC | PRN
Start: 2012-12-05 — End: 2012-12-19
  Administered 2012-12-07 – 2012-12-11 (×6): 3 mg via ORAL
  Filled 2012-12-05 (×5): qty 9

## 2012-12-05 MED ORDER — MAGIC MOUTHWASH
15.0000 mL | Freq: Four times a day (QID) | ORAL | Status: DC | PRN
  Filled 2012-12-05: qty 15

## 2012-12-05 MED ORDER — ONDANSETRON HCL 4 MG/2ML IJ SOLN
4.0000 mg | Freq: Once | INTRAMUSCULAR | Status: AC
  Administered 2012-12-05: 4 mg via INTRAVENOUS

## 2012-12-05 MED ORDER — LORAZEPAM 2 MG/ML IJ SOLN
0.5000 mg | Freq: Three times a day (TID) | INTRAMUSCULAR | Status: DC | PRN
  Administered 2012-12-06: 1 mg via INTRAVENOUS
  Filled 2012-12-05: qty 1

## 2012-12-05 MED ORDER — INFLUENZA VAC SPLIT QUAD 0.5 ML IM SUSP
0.5000 mL | INTRAMUSCULAR | Status: AC
  Administered 2012-12-08: 0.5 mL via INTRAMUSCULAR
  Filled 2012-12-05 (×3): qty 0.5

## 2012-12-05 NOTE — ED Provider Notes (Signed)
CSN: [id number]     Arrival date & time 12/05/12  1600 History   First MD Initiated Contact with Patient 12/05/12 1631     No chief complaint on file.  (Consider location/radiation/quality/duration/timing/severity/associated sxs/prior Treatment) HPI Comments: 42 year old male with abdominal pain and nausea since last night. States she's not had a bowel movement in 3 days. Is exactly similar to when he had a "bowel blockage" last year. She's also had one other instance of this which also felt similar. The pain is localized in his lower abdomen. Is not had any distention that he knows of. The pain is severe at this time. Denies any fevers or chills.   Past Medical History  Diagnosis Date  . Bronchitis   . Asthma   . SBO (small bowel obstruction)   . Gastroenteritis    Past Surgical History  Procedure Laterality Date  . Abdominal surgery    . Bowel resection     No family history on file. History  Substance Use Topics  . Smoking status: Never Smoker   . Smokeless tobacco: Not on file  . Alcohol Use: No    Review of Systems  Gastrointestinal: Positive for nausea, abdominal pain and constipation. Negative for vomiting and abdominal distention.  Genitourinary: Negative for dysuria.  Musculoskeletal: Negative for back pain.  All other systems reviewed and are negative.    Allergies  Shellfish allergy  Home Medications   Current Outpatient Rx  Name  Route  Sig  Dispense  Refill  . dicyclomine (BENTYL) 20 MG tablet   Oral   Take 1 tablet (20 mg total) by mouth 2 (two) times daily.   20 tablet   0   . HYDROcodone-acetaminophen (NORCO/VICODIN) 5-325 MG per tablet   Oral   Take 1 tablet by mouth every 6 (six) hours as needed for pain.   13 tablet   0   . HYDROcodone-acetaminophen (NORCO/VICODIN) 5-325 MG per tablet   Oral   Take 1 tablet by mouth every 6 (six) hours as needed for pain.   8 tablet   0   . ibuprofen (ADVIL,MOTRIN) 600 MG tablet   Oral   Take 1  tablet (600 mg total) by mouth every 6 (six) hours as needed for pain.   30 tablet   0   . Multiple Vitamin (MULTIVITAMIN WITH MINERALS) TABS tablet   Oral   Take 1 tablet by mouth daily.          There were no vitals taken for this visit. Physical Exam  Nursing note and vitals reviewed. Constitutional: He is oriented to person, place, and time. He appears well-developed and well-nourished.  HENT:  Head: Normocephalic and atraumatic.  Right Ear: External ear normal.  Left Ear: External ear normal.  Nose: Nose normal.  Eyes: Right eye exhibits no discharge. Left eye exhibits no discharge.  Neck: Neck supple.  Cardiovascular: Normal rate, regular rhythm, normal heart sounds and intact distal pulses.   Pulmonary/Chest: Effort normal and breath sounds normal.  Abdominal: Soft. He exhibits distension (mild). There is tenderness. There is no rebound and no guarding.  Neurological: He is alert and oriented to person, place, and time.  Skin: Skin is warm and dry.    ED Course  Procedures (including critical care time) Labs Review Labs Reviewed  CBC WITH DIFFERENTIAL - Abnormal; Notable for the following:    Neutrophils Relative % 84 (*)    Neutro Abs 8.1 (*)    Lymphocytes Relative 10 (*)  All other components within normal limits  COMPREHENSIVE METABOLIC PANEL - Abnormal; Notable for the following:    GFR calc non Af Amer 81 (*)    All other components within normal limits  URINALYSIS, ROUTINE W REFLEX MICROSCOPIC - Abnormal; Notable for the following:    APPearance CLOUDY (*)    All other components within normal limits  LIPASE, BLOOD  BASIC METABOLIC PANEL  CBC  CG4 I-STAT (LACTIC ACID)   Imaging Review Ct Abdomen Pelvis W Contrast  12/05/2012   CLINICAL DATA:  Sharp abdominal pain, history of small-bowel obstruction  EXAM: CT ABDOMEN AND PELVIS WITH CONTRAST  TECHNIQUE: Multidetector CT imaging of the abdomen and pelvis was performed using the standard protocol  following bolus administration of intravenous contrast.  CONTRAST:  OMNIPAQUE IOHEXOL 300 MG/ML  SOLN  COMPARISON:  06/29/2012  FINDINGS: Minor dependent basilar atelectasis. Normal heart size. No pericardial or pleural effusion. No hiatal hernia.  Abdomen: Fluid filled dilated loops of small bowel in the left abdomen diffusely compatible with a small bowel obstruction in the jejunum. Small amount left upper quadrant free fluid and mesenteric edema/ vascular congestion. No evidence of perforation or free air. Dilated loops are angulated. This could be related to adhesions from prior abdominal surgery versus an internal hernia. No evidence of definite mass or adenopathy.  Liver, gallbladder, biliary system, pancreas, spleen, adrenal glands, and kidneys are within normal limits for age and demonstrate no acute process. Stool noted in the right colon diffusely. Distal colon is decompressed.  No fluid collection, hemorrhage, abscess or adenopathy.  Pelvis: Small amount of pelvic free fluid. Urinary bladder unremarkable. Distal small bowel is completely collapsed. No fluid collection, hemorrhage, adenopathy, inguinal abnormality, or hernia.  No osseous abnormality.  IMPRESSION: Left upper quadrant small bowel obstruction pattern within the jejunum, suspect related to adhesions or possibly an internal hernia.  Negative for free air or an abscess  Small amount of abdominal and pelvic free fluid.   Electronically Signed   By: Ruel Favors M.D.   On: 12/05/2012 19:37   Dg Abd Acute W/chest  12/05/2012   CLINICAL DATA:  abdomen pain with nausea and vomiting  EXAM: ACUTE ABDOMEN SERIES (ABDOMEN 2 VIEW & CHEST 1 VIEW)  COMPARISON:  November 27, 2012  FINDINGS: PA chest: Lungs are clear. Heart size and pulmonary vascularity are normal. No adenopathy.  Supine and upright abdomen: There is moderate stool throughout the colon. There is a loop of mildly dilated small bowel in the left mid abdomen. There are several  air-fluid levels. No free air. There is a surgical clip in the right lower quadrant.  IMPRESSION: Bowel gas pattern is suggestive of enteritis or early ileus. Obstruction is felt to be less likely. No free air. No edema or consolidation.   Electronically Signed   By: Bretta Bang M.D.   On: 12/05/2012 18:15    EKG Interpretation   None       MDM   1. SBO (small bowel obstruction)    No vomiting while in ED. Pain controlled with IV narcotics. SBO seen as above. Patient denies any surgeries besides those for bowel obstructions at outside hospitals. Consulted surgery (Dr. Michaell Cowing) who will admit patient for further care.     Audree Camel, MD 12/06/12 Marlyne Beards

## 2012-12-05 NOTE — ED Notes (Signed)
Dr. Michaell Cowing with surgery made aware that pt is not able to tolerate NG tube insertion.

## 2012-12-05 NOTE — ED Notes (Signed)
Pt states that he started having abdominal pain last night that comes and goes. Pt states that happens when he cant have a BM, last BM was 3 days ago. 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 

## 2012-12-05 NOTE — H&P (Signed)
Gregory Castro  04-04-1970 [id number]  CARE TEAM:  PCP: No PCP Per Patient  Outpatient Care Team: Patient Care Team: No Pcp Per Patient as PCP - General (General Practice)  Inpatient Treatment Team: Treatment Team: Attending Provider: Bishop Limbo, MD; Registered Nurse: Lytle Michaels, RN; Technician: Leonel Ramsay, NT; Registered Nurse: Coralee Pesa, RN; Technician: Ranae Plumber, NT; Consulting Physician: Bishop Limbo, MD  This patient is a 42 y.o.male who presents today for surgical evaluation at the request of Dr. Pricilla Loveless, [facility] ED.   Reason for evaluation: Small bowel obstruction  Man with chronic abdominal pain and intermittent constipation.  He has had a history of numerous abdominal surgeries.  He recalls his first surgery when he is [age] years old when his bowels "stopped working".  This is when he lived in [place].  He has been more in this region for some time.  Was admitted to [place] for what sounds like a bowel obstruction.  Nasogastric tube.  IV fluids given.  Opened up and improved and went home around four days.  Had a similar episode a few years ago.  When to [facility].  Required surgery.  He thinks there was a small bowel resection as well.  He struggles to have bowel movements every day to every third day.  Occasionally has used enemas to help open things up.  Gets intermittent bloating and crampy abdominal pain.  Has gone to the emergency room numerous times.  Prior evaluations not strongly suggestive of bowel obstruction.  However the patient has not had any flatus or bowel movement for the past two days and feels more distended.  Uncomfortable.  Can barely tolerate sips.  Came to emergency room.  Concern for bowel obstruction given more distention and discomfort.  CAT scan suspicious of this  Past Medical History  Diagnosis Date  . Bronchitis   . Asthma   . SBO (small bowel obstruction)   . Gastroenteritis     Past Surgical History   Procedure Laterality Date  . Abdominal surgery  42 y/o    ?Malrotation?  . Bowel resection  2012?    [facility]    History   Social History  . Marital Status: Single    Spouse Name: N/A    Number of Children: N/A  . Years of Education: N/A   Occupational History  . Not on file.   Social History Main Topics  . Smoking status: Never Smoker   . Smokeless tobacco: Not on file  . Alcohol Use: No  . Drug Use: No  . Sexual Activity: Not on file   Other Topics Concern  . Not on file   Social History Narrative  . No narrative on file    No family history on file.  Current Facility-Administered Medications  Medication Dose Route Frequency Provider Last Rate Last Dose  . acetaminophen (TYLENOL) suppository 650 mg  650 mg Rectal Q6H PRN Ardeth Sportsman, MD      . alum & mag hydroxide-simeth (MAALOX/MYLANTA) 200-200-20 MG/5ML suspension 30 mL  30 mL Oral Q6H PRN Ardeth Sportsman, MD      . bisacodyl (DULCOLAX) suppository 10 mg  10 mg Rectal Q12H PRN Ardeth Sportsman, MD      . dextrose 5 % and 0.45 % NaCl with KCl 40 mEq/L infusion   Intravenous Continuous Ardeth Sportsman, MD      . diphenhydrAMINE (BENADRYL) injection 12.5-25 mg  12.5-25 mg Intravenous Q6H  PRN Ardeth Sportsman, MD      . heparin injection 5,000 Units  5,000 Units Subcutaneous Q8H Ardeth Sportsman, MD      . HYDROmorphone (DILAUDID) injection 0.5-2 mg  0.5-2 mg Intravenous Q2H PRN Ardeth Sportsman, MD      . ketorolac (TORADOL) 15 MG/ML injection 15-30 mg  15-30 mg Intravenous Q6H PRN Ardeth Sportsman, MD      . lactated ringers bolus 1,000 mL  1,000 mL Intravenous Q8H PRN Ardeth Sportsman, MD      . lip balm (CARMEX) ointment 1 application  1 application Topical BID Ardeth Sportsman, MD      . LORazepam (ATIVAN) injection 0.5-1 mg  0.5-1 mg Intravenous Q8H PRN Ardeth Sportsman, MD      . magic mouthwash  15 mL Oral QID PRN Ardeth Sportsman, MD      . menthol-cetylpyridinium (CEPACOL) lozenge 3 mg  1  lozenge Oral PRN Ardeth Sportsman, MD      . metoprolol (LOPRESSOR) injection 5 mg  5 mg Intravenous Q6H PRN Ardeth Sportsman, MD      . ondansetron (ZOFRAN) injection 4 mg  4 mg Intravenous Q6H PRN Ardeth Sportsman, MD      . phenol (CHLORASEPTIC) mouth spray 2 spray  2 spray Mouth/Throat PRN Ardeth Sportsman, MD      . promethazine (PHENERGAN) injection 12.5-25 mg  12.5-25 mg Intravenous Q6H PRN Ardeth Sportsman, MD      . sodium chloride 0.9 % bolus 1,000 mL  1,000 mL Intravenous Once Ardeth Sportsman, MD       Current Outpatient Prescriptions  Medication Sig Dispense Refill  . Multiple Vitamin (MULTIVITAMIN WITH MINERALS) TABS tablet Take 1 tablet by mouth daily.      Marland Kitchen HYDROcodone-acetaminophen (NORCO/VICODIN) 5-325 MG per tablet Take 1 tablet by mouth every 6 (six) hours as needed for pain.  8 tablet  0  . ibuprofen (ADVIL,MOTRIN) 600 MG tablet Take 1 tablet (600 mg total) by mouth every 6 (six) hours as needed for pain.  30 tablet  0     Allergies  Allergen Reactions  . Shellfish Allergy Anaphylaxis and Swelling  . Bentyl [Dicyclomine] Nausea And Vomiting    ROS: Constitutional:  No fevers, chills, sweats.  Weight stable Eyes:  No vision changes, No discharge HENT:  No sore throats, nasal drainage Lymph: No neck swelling, No bruising easily Pulmonary:  No cough, productive sputum CV: No orthopnea, PND  Patient walks 30 minutes for about 1 miles without difficulty.  No exertional chest/neck/shoulder/arm pain. GI: No personal nor family history of GI/colon cancer, inflammatory bowel disease, irritable bowel syndrome, allergy such as Celiac Sprue, dietary/dairy problems, colitis, ulcers nor gastritis.  No recent sick contacts/gastroenteritis.  No travel outside the country.  No changes in diet. Renal: No UTIs, No hematuria Genital:  No drainage, bleeding, masses Musculoskeletal: No severe joint pain.  Good ROM major joints Skin:  No sores or lesions.  No rashes Heme/Lymph:  No easy  bleeding.  No swollen lymph nodes Neuro: No focal weakness/numbness.  No seizures Psych: No suicidal ideation.  No hallucinations  BP 110/80  Pulse 68  Temp(Src) 98.1 F (36.7 C) (Oral)  Resp 17  SpO2 98%  Physical Exam: General: Pt awake/alert/oriented x4 in no major acute distress Eyes: PERRL, normal EOM. Sclera nonicteric Neuro: CN II-XII intact w/o focal sensory/motor deficits. Lymph: No head/neck/groin lymphadenopathy Psych:  No delerium/psychosis/paranoia.  Anxious.  He grabs  my hand before I can even touch him.  He is consolable HENT: Normocephalic, Mucus membranes moist.  No thrush Neck: Supple, No tracheal deviation Chest: No pain.  Good respiratory excursion. CV:  Pulses intact.  Regular rhythm Abdomen: Soft, Mildly distended.  Mildly tender.  Right paramedian and periumbilical vertical incisions.  Sensitive at skin.  No pain with cough or bed shake.  No peritonitis.No incarcerated hernias. Gen:  NEMG,  No inguinal hernias.  Refuses rectal exam Ext:  SCDs BLE.  No significant edema.  No cyanosis Skin: No petechiae / purpurea.  No major sores Musculoskeletal: No severe joint pain.  Good ROM major joints   Results:   Labs: Results for orders placed during the hospital encounter of 12/05/12 (from the past 48 hour(s))  CBC WITH DIFFERENTIAL     Status: Abnormal   Collection Time    12/05/12  5:55 PM      Result Value Range   WBC 9.7  4.0 - 10.5 K/uL   RBC 5.11  4.22 - 5.81 MIL/uL   Hemoglobin 15.1  13.0 - 17.0 g/dL   HCT 40.9  81.1 - 91.4 %   MCV 82.8  78.0 - 100.0 fL   MCH 29.5  26.0 - 34.0 pg   MCHC 35.7  30.0 - 36.0 g/dL   RDW 78.2  95.6 - 21.3 %   Platelets 156  150 - 400 K/uL   Neutrophils Relative % 84 (*) 43 - 77 %   Neutro Abs 8.1 (*) 1.7 - 7.7 K/uL   Lymphocytes Relative 10 (*) 12 - 46 %   Lymphs Abs 1.0  0.7 - 4.0 K/uL   Monocytes Relative 5  3 - 12 %   Monocytes Absolute 0.5  0.1 - 1.0 K/uL   Eosinophils Relative 1  0 - 5 %   Eosinophils Absolute  0.1  0.0 - 0.7 K/uL   Basophils Relative 0  0 - 1 %   Basophils Absolute 0.0  0.0 - 0.1 K/uL  COMPREHENSIVE METABOLIC PANEL     Status: Abnormal   Collection Time    12/05/12  5:55 PM      Result Value Range   Sodium 138  135 - 145 mEq/L   Potassium 4.1  3.5 - 5.1 mEq/L   Chloride 103  96 - 112 mEq/L   CO2 26  19 - 32 mEq/L   Glucose, Bld 92  70 - 99 mg/dL   BUN 8  6 - 23 mg/dL   Creatinine, Ser 0.86  0.50 - 1.35 mg/dL   Calcium 8.8  8.4 - 57.8 mg/dL   Total Protein 7.0  6.0 - 8.3 g/dL   Albumin 3.7  3.5 - 5.2 g/dL   AST 19  0 - 37 U/L   ALT 14  0 - 53 U/L   Alkaline Phosphatase 76  39 - 117 U/L   Total Bilirubin 0.6  0.3 - 1.2 mg/dL   GFR calc non Af Amer 81 (*) >90 mL/min   GFR calc Af Amer >90  >90 mL/min   Comment: (NOTE)     The eGFR has been calculated using the CKD EPI equation.     This calculation has not been validated in all clinical situations.     eGFR's persistently <90 mL/min signify possible Chronic Kidney     Disease.  LIPASE, BLOOD     Status: None   Collection Time    12/05/12  5:55 PM      Result  Value Range   Lipase 17  11 - 59 U/L  CG4 I-STAT (LACTIC ACID)     Status: None   Collection Time    12/05/12  5:55 PM      Result Value Range   Lactic Acid, Venous 1.67  0.5 - 2.2 mmol/L  URINALYSIS, ROUTINE W REFLEX MICROSCOPIC     Status: Abnormal   Collection Time    12/05/12  6:27 PM      Result Value Range   Color, Urine YELLOW  YELLOW   APPearance CLOUDY (*) CLEAR   Specific Gravity, Urine 1.022  1.005 - 1.030   pH 7.5  5.0 - 8.0   Glucose, UA NEGATIVE  NEGATIVE mg/dL   Hgb urine dipstick NEGATIVE  NEGATIVE   Bilirubin Urine NEGATIVE  NEGATIVE   Ketones, ur NEGATIVE  NEGATIVE mg/dL   Protein, ur NEGATIVE  NEGATIVE mg/dL   Urobilinogen, UA 1.0  0.0 - 1.0 mg/dL   Nitrite NEGATIVE  NEGATIVE   Leukocytes, UA NEGATIVE  NEGATIVE   Comment: MICROSCOPIC NOT DONE ON URINES WITH NEGATIVE PROTEIN, BLOOD, LEUKOCYTES, NITRITE, OR GLUCOSE <1000 mg/dL.     Imaging / Studies: Ct Abdomen Pelvis W Contrast  12/05/2012   CLINICAL DATA:  Sharp abdominal pain, history of small-bowel obstruction  EXAM: CT ABDOMEN AND PELVIS WITH CONTRAST  TECHNIQUE: Multidetector CT imaging of the abdomen and pelvis was performed using the standard protocol following bolus administration of intravenous contrast.  CONTRAST:  OMNIPAQUE IOHEXOL 300 MG/ML  SOLN  COMPARISON:  06/29/2012  FINDINGS: Minor dependent basilar atelectasis. Normal heart size. No pericardial or pleural effusion. No hiatal hernia.  Abdomen: Fluid filled dilated loops of small bowel in the left abdomen diffusely compatible with a small bowel obstruction in the jejunum. Small amount left upper quadrant free fluid and mesenteric edema/ vascular congestion. No evidence of perforation or free air. Dilated loops are angulated. This could be related to adhesions from prior abdominal surgery versus an internal hernia. No evidence of definite mass or adenopathy.  Liver, gallbladder, biliary system, pancreas, spleen, adrenal glands, and kidneys are within normal limits for age and demonstrate no acute process. Stool noted in the right colon diffusely. Distal colon is decompressed.  No fluid collection, hemorrhage, abscess or adenopathy.  Pelvis: Small amount of pelvic free fluid. Urinary bladder unremarkable. Distal small bowel is completely collapsed. No fluid collection, hemorrhage, adenopathy, inguinal abnormality, or hernia.  No osseous abnormality.  IMPRESSION: Left upper quadrant small bowel obstruction pattern within the jejunum, suspect related to adhesions or possibly an internal hernia.  Negative for free air or an abscess  Small amount of abdominal and pelvic free fluid.   Electronically Signed   By: Ruel Favors M.D.   On: 12/05/2012 19:37   I reviewed the CAT scan with Dr. Denny Levy.  I agree that the proximal jejunal loops are very dilated while  the distal ileum is decompressed.  Hard to know where the  transition point is for certain.  However there is some swirling of the jejunal mesentery and kinking and maybe some mild stranding of the mesentery in the left paramedian upper quadrant area near the ligament of Treitz.  Not present in May.  Suspicious for perhaps some internal hernia band or other abnormality.  No pneumatosis.  No perforation.  No ischemia.  Dg Abd Acute W/chest  12/05/2012   CLINICAL DATA:  abdomen pain with nausea and vomiting  EXAM: ACUTE ABDOMEN SERIES (ABDOMEN 2 VIEW & CHEST 1 VIEW)  COMPARISON:  November 27, 2012  FINDINGS: PA chest: Lungs are clear. Heart size and pulmonary vascularity are normal. No adenopathy.  Supine and upright abdomen: There is moderate stool throughout the colon. There is a loop of mildly dilated small bowel in the left mid abdomen. There are several air-fluid levels. No free air. There is a surgical clip in the right lower quadrant.  IMPRESSION: Bowel gas pattern is suggestive of enteritis or early ileus. Obstruction is felt to be less likely. No free air. No edema or consolidation.   Electronically Signed   By: Bretta Bang M.D.   On: 12/05/2012 18:15   Dg Abd Acute W/chest  11/27/2012   CLINICAL DATA:  Abdominal pain  EXAM: ACUTE ABDOMEN SERIES (ABDOMEN 2 VIEW & CHEST 1 VIEW)  COMPARISON:  10/25/2012  FINDINGS: The lungs are clear without focal consolidation, edema, effusion or pneumothorax. Cardio pericardial silhouette is within normal limits for size. Imaged bony structures of the thorax are intact.  Upright film shows no evidence for intraperitoneal free air. There is no evidence for gaseous bowel dilation to suggest obstruction. No unexpected abdominal pelvic calcification. Visualized bony structures are unremarkable. Surgical clip again noted overlying the right sacrum.  IMPRESSION: Normal chest x-ray.  Nonspecific bowel gas pattern.   Electronically Signed   By: Kennith Center M.D.   On: 11/27/2012 15:47    Medications / Allergies: per  chart  Antibiotics: Anti-infectives   None      Assessment  Greggory Brandy  42 y.o. male       Problem List:  Principal Problem:   SBO (small bowel obstruction) Active Problems:   Chronic abdominal pain   Constipation, chronic   Bloating abdominal pain and obstipation with evidence suspicious for bowel obstruction.  Possibly due to internal hernia from prior surgeries adhesions  Plan:  Admit  IV fluids  Nasogastric tube decompression.  The fluid to better palliate his pain.  He is not asking for drugs right now and wishes to minimize narcotics if possible.  Followup x-ray studies  I suspect he would benefit from diagnostic laparoscopy lysis of adhesions to rule out an internal hernia.  However, I would rehydrate him first and try and get his bowels less distended so surgery happens it can happen more smoothly.  Important to get prior medical records from [facility] to find that what was done.  Would be helpful to get more accurate record since the patient is a poor historian and cannot give inside.  I strongly suspect he would benefit from a fiber bowel regimen once this is subtle.  -VTE prophylaxis- SCDs, etc  -mobilize as tolerated to help recovery  I updated the patient's status to the patient & His significant other with the ED RNs in the room.  D/w Dr Harolyn Rutherford ED MD.  Recommendations were made.  Questions were answered.  They expressed understanding & appreciation.    Ardeth Sportsman, M.D., F.A.C.S. Gastrointestinal and Minimally Invasive Surgery [facility] [address] [place], [place] [address] [phone] Main / Paging   12/05/2012

## 2012-12-05 NOTE — ED Notes (Signed)
Pt states that he started having abdominal pain last night that comes and goes. Pt states that happens when he cant have a BM, last BM was 3 days ago. 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

## 2012-12-05 NOTE — Progress Notes (Signed)
[facility] provided pt with a list of primary care resources.  °

## 2012-12-05 NOTE — ED Notes (Signed)
Informed by registration at 1630 that charting for this patient was done on a different patient. All orders by Dr. Criss Alvine were initially placed at 1432 and 1532. Charting updated. All triage done by Eliezer Lofts, RN but charted by this RN because of chart error. Medication given by Lytle Michaels RN. Assessments and hourly roundings up until 1711 were done by Lytle Michaels RN but charted by this RN.

## 2012-12-05 NOTE — Progress Notes (Signed)
Utilization Review completed.  Roan Miklos RN CM  

## 2012-12-05 NOTE — ED Notes (Addendum)
Pt c/o stabbing abdominal pain since last night. 10/10 pain score. Pt c/o nausea, last BM was 3 days ago. 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 A & O x 4.

## 2012-12-05 NOTE — ED Notes (Signed)
Pt c/o stabbing abdominal pain since last night. 10/10 pain score. Pt c/o Nausea. Last bowel movement was 3 days ago. 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

## 2012-12-05 NOTE — ED Notes (Signed)
Unsuccessful attempt x 2 for NG tube insertion. Pt is unable to tolerate any attempt at NG insertion. Pt screams and becomes very anxious and tenses up at any attempt for insertion. MD Criss Alvine made aware.

## 2012-12-06 LAB — BASIC METABOLIC PANEL WITH GFR
BUN: 7 mg/dL (ref 6–23)
CO2: 26 meq/L (ref 19–32)
Calcium: 8.5 mg/dL (ref 8.4–10.5)
Chloride: 104 meq/L (ref 96–112)
Creatinine, Ser: 1.14 mg/dL (ref 0.50–1.35)
GFR calc Af Amer: >90 mL/min
GFR calc non Af Amer: 78 mL/min — ABNORMAL LOW
Glucose, Bld: 119 mg/dL — ABNORMAL HIGH (ref 70–99)
Potassium: 3.9 meq/L (ref 3.5–5.1)
Sodium: 137 meq/L (ref 135–145)

## 2012-12-06 LAB — CBC
HCT: 38.7 % — ABNORMAL LOW (ref 39.0–52.0)
MCH: 29.4 pg (ref 26.0–34.0)
MCHC: 35.9 g/dL (ref 30.0–36.0)
Platelets: 193 10*3/uL (ref 150–400)
RDW: 12.6 % (ref 11.5–15.5)

## 2012-12-06 NOTE — Progress Notes (Signed)
Patient does not want NG inserted.  ER nurse Mardella Layman alerted admitting physician that patient was unable to tolerate the NG insertion and that it would be revisited in am

## 2012-12-06 NOTE — Progress Notes (Signed)
Subjective: Refused NG last night  Objective: Vital signs in last 24 hours: Temp:  [98.1 F (36.7 C)-98.8 F (37.1 C)] 98.8 F (37.1 C) (10/29 0600) Pulse Rate:  [62-68] 64 (10/29 0600) Resp:  [17-18] 18 (10/29 0600) BP: (103-133)/(66-89) 121/83 mmHg (10/29 0600) SpO2:  [96 %-98 %] 97 % (10/29 0600) Weight:  [150 lb (68.04 kg)] 150 lb (68.04 kg) (10/28 2122) Last BM Date: 12/02/12  Intake/Output from previous day: 10/28 0701 - 10/29 0700 In: 1366.7 [I.V.:1366.7] Out: 501 [Urine:501] Intake/Output this shift:    General appearance: no distress GI: abdomen is soft, and is not very distended, has right paramedian incision and midline incision.  no peritoneal signs but has LUQ voluntary guarding.    Lab Results:   Recent Labs  12/05/12 1755 12/06/12 0450  WBC 9.7 6.6  HGB 15.1 13.9  HCT 42.3 38.7*  PLT 156 193   BMET  Recent Labs  12/05/12 1755 12/06/12 0450  NA 138 137  K 4.1 3.9  CL 103 104  CO2 26 26  GLUCOSE 92 119*  BUN 8 7  CREATININE 1.10 1.14  CALCIUM 8.8 8.5   PT/INR No results found for this basename: LABPROT, INR,  in the last 72 hours ABG No results found for this basename: PHART, PCO2, PO2, HCO3,  in the last 72 hours  Studies/Results: Ct Abdomen Pelvis W Contrast  12/05/2012   CLINICAL DATA:  Sharp abdominal pain, history of small-bowel obstruction  EXAM: CT ABDOMEN AND PELVIS WITH CONTRAST  TECHNIQUE: Multidetector CT imaging of the abdomen and pelvis was performed using the standard protocol following bolus administration of intravenous contrast.  CONTRAST:  OMNIPAQUE IOHEXOL 300 MG/ML  SOLN  COMPARISON:  06/29/2012  FINDINGS: Minor dependent basilar atelectasis. Normal heart size. No pericardial or pleural effusion. No hiatal hernia.  Abdomen: Fluid filled dilated loops of small bowel in the left abdomen diffusely compatible with a small bowel obstruction in the jejunum. Small amount left upper quadrant free fluid and mesenteric  edema/ vascular congestion. No evidence of perforation or free air. Dilated loops are angulated. This could be related to adhesions from prior abdominal surgery versus an internal hernia. No evidence of definite mass or adenopathy.  Liver, gallbladder, biliary system, pancreas, spleen, adrenal glands, and kidneys are within normal limits for age and demonstrate no acute process. Stool noted in the right colon diffusely. Distal colon is decompressed.  No fluid collection, hemorrhage, abscess or adenopathy.  Pelvis: Small amount of pelvic free fluid. Urinary bladder unremarkable. Distal small bowel is completely collapsed. No fluid collection, hemorrhage, adenopathy, inguinal abnormality, or hernia.  No osseous abnormality.  IMPRESSION: Left upper quadrant small bowel obstruction pattern within the jejunum, suspect related to adhesions or possibly an internal hernia.  Negative for free air or an abscess  Small amount of abdominal and pelvic free fluid.   Electronically Signed   By: Ruel Favors M.D.   On: 12/05/2012 19:37   Dg Abd Acute W/chest  12/05/2012   CLINICAL DATA:  abdomen pain with nausea and vomiting  EXAM: ACUTE ABDOMEN SERIES (ABDOMEN 2 VIEW & CHEST 1 VIEW)  COMPARISON:  November 27, 2012  FINDINGS: PA chest: Lungs are clear. Heart size and pulmonary vascularity are normal. No adenopathy.  Supine and upright abdomen: There is moderate stool throughout the colon. There is a loop of mildly dilated small bowel in the left mid abdomen. There are several air-fluid levels. No free air. There is a surgical clip in the right  lower quadrant.  IMPRESSION: Bowel gas pattern is suggestive of enteritis or early ileus. Obstruction is felt to be less likely. No free air. No edema or consolidation.   Electronically Signed   By: Bretta Bang M.D.   On: 12/05/2012 18:15    Anti-infectives: Anti-infectives   None      Assessment/Plan: s/p * No surgery found * I am concerned for possible closed loop  obstruction.  However, this is from CT only.  He is sleeping well and appears very comfortable though his pain scores are recorded as high.  His HR is normal, vitals normal, AF, and wbc normal.  He may need surgery for this but he has recently had a similar presentation that was treated nonoperatively about 4 months ago.  I have asked again for his records from [place] from his op note as well as his recent admission.  If he does not improve quickly with NG placement, then will plan for ex. Lap and LOA.    LOS: 1 day    Gregory Castro 12/06/2012

## 2012-12-06 NOTE — Progress Notes (Signed)
Resting comfortably.  Remains AF, HR normal.  Abdomen exam improved.  Not much out of NG.  I am still concerned given his CT but since he seems to be feeling better will continue nonop management.

## 2012-12-06 NOTE — Care Management Note (Signed)
    Page 1 of 1   12/06/2012     10:20:22 AM   CARE MANAGEMENT NOTE 12/06/2012  Patient:  Gregory Castro, Gregory Castro   Account Number:  [id number]  Date Initiated:  12/06/2012  Documentation initiated by:  Lorenda Ishihara  Subjective/Objective Assessment:   42 yo male admitted with SBO. PTA lived at home with girlfriend.     Action/Plan:   Home when stable   Anticipated DC Date:  12/09/2012   Anticipated DC Plan:  HOME/SELF CARE      DC Planning Services  CM consult      Choice offered to / List presented to:             Status of service:  Completed, signed off Medicare Important Message given?   (If response is "NO", the following Medicare IM given date fields will be blank) Date Medicare IM given:   Date Additional Medicare IM given:    Discharge Disposition:  HOME/SELF CARE  Per UR Regulation:  Reviewed for med. necessity/level of care/duration of stay  If discussed at Long Length of Stay Meetings, dates discussed:    Comments:

## 2012-12-07 ENCOUNTER — Encounter (HOSPITAL_COMMUNITY): Admission: EM | Disposition: A | Discharge status: Home / Self Care | Payer: Self-pay | Source: Home / Self Care

## 2012-12-07 ENCOUNTER — Inpatient Hospital Stay (HOSPITAL_COMMUNITY): Payer: Medicaid Other | Admitting: Anesthesiology

## 2012-12-07 ENCOUNTER — Encounter (HOSPITAL_COMMUNITY): Payer: Self-pay | Admitting: Anesthesiology

## 2012-12-07 ENCOUNTER — Inpatient Hospital Stay (HOSPITAL_COMMUNITY): Payer: Medicaid Other

## 2012-12-07 ENCOUNTER — Encounter (HOSPITAL_COMMUNITY): Payer: Medicaid Other | Admitting: Anesthesiology

## 2012-12-07 HISTORY — PX: LAPAROTOMY: SHX154

## 2012-12-07 LAB — SURGICAL PCR SCREEN
MRSA, PCR: NEGATIVE
Staphylococcus aureus: NEGATIVE

## 2012-12-07 SURGERY — Surgical Case
Anesthesia: *Unknown

## 2012-12-07 SURGERY — LAPAROTOMY, EXPLORATORY
Anesthesia: General | Site: Abdomen | Wound class: Clean

## 2012-12-07 MED ORDER — ONDANSETRON HCL 4 MG/2ML IJ SOLN
INTRAMUSCULAR | Status: DC | PRN
  Administered 2012-12-07: 4 mg via INTRAVENOUS

## 2012-12-07 MED ORDER — EPHEDRINE SULFATE 50 MG/ML IJ SOLN
INTRAMUSCULAR | Status: DC | PRN
  Administered 2012-12-07: 5 mg via INTRAVENOUS

## 2012-12-07 MED ORDER — HYDROMORPHONE HCL PF 1 MG/ML IJ SOLN
INTRAMUSCULAR | Status: AC
  Filled 2012-12-07: qty 1

## 2012-12-07 MED ORDER — LACTATED RINGERS IV SOLN
INTRAVENOUS | Status: DC | PRN
  Administered 2012-12-07 (×4): via INTRAVENOUS

## 2012-12-07 MED ORDER — NEOSTIGMINE METHYLSULFATE 1 MG/ML IJ SOLN
INTRAMUSCULAR | Status: DC | PRN
  Administered 2012-12-07: 3 mg via INTRAVENOUS

## 2012-12-07 MED ORDER — HEPARIN SODIUM (PORCINE) 5000 UNIT/ML IJ SOLN
5000.0000 [IU] | Freq: Three times a day (TID) | INTRAMUSCULAR | Status: DC
  Filled 2012-12-07 (×3): qty 1

## 2012-12-07 MED ORDER — 0.9 % SODIUM CHLORIDE (POUR BTL) OPTIME
TOPICAL | Status: DC | PRN
  Administered 2012-12-07: 2000 mL

## 2012-12-07 MED ORDER — LIDOCAINE HCL (CARDIAC) 20 MG/ML IV SOLN
INTRAVENOUS | Status: DC | PRN
  Administered 2012-12-07: 50 mg via INTRAVENOUS

## 2012-12-07 MED ORDER — SUCCINYLCHOLINE CHLORIDE 20 MG/ML IJ SOLN
INTRAMUSCULAR | Status: DC | PRN
  Administered 2012-12-07: 100 mg via INTRAVENOUS

## 2012-12-07 MED ORDER — LACTATED RINGERS IV SOLN: INTRAVENOUS | Status: DC

## 2012-12-07 MED ORDER — ENOXAPARIN SODIUM 40 MG/0.4ML ~~LOC~~ SOLN
40.0000 mg | SUBCUTANEOUS | Status: DC
  Administered 2012-12-08 – 2012-12-18 (×11): 40 mg via SUBCUTANEOUS
  Filled 2012-12-07 (×12): qty 0.4

## 2012-12-07 MED ORDER — DEXTROSE 5 % IV SOLN
2.0000 g | INTRAVENOUS | Status: AC
  Administered 2012-12-07: 2 g via INTRAVENOUS
  Filled 2012-12-07: qty 2

## 2012-12-07 MED ORDER — MIDAZOLAM HCL 5 MG/5ML IJ SOLN
INTRAMUSCULAR | Status: DC | PRN
  Administered 2012-12-07: 2 mg via INTRAVENOUS

## 2012-12-07 MED ORDER — HEPARIN SODIUM (PORCINE) 5000 UNIT/ML IJ SOLN: 5000.0000 [IU] | Freq: Three times a day (TID) | INTRAMUSCULAR | Status: DC

## 2012-12-07 MED ORDER — FENTANYL CITRATE 0.05 MG/ML IJ SOLN
INTRAMUSCULAR | Status: DC | PRN
  Administered 2012-12-07: 100 ug via INTRAVENOUS
  Administered 2012-12-07: 50 ug via INTRAVENOUS
  Administered 2012-12-07: 100 ug via INTRAVENOUS

## 2012-12-07 MED ORDER — ROCURONIUM BROMIDE 100 MG/10ML IV SOLN
INTRAVENOUS | Status: DC | PRN
  Administered 2012-12-07 (×2): 10 mg via INTRAVENOUS
  Administered 2012-12-07: 25 mg via INTRAVENOUS
  Administered 2012-12-07: 5 mg via INTRAVENOUS

## 2012-12-07 MED ORDER — HYDROMORPHONE HCL PF 1 MG/ML IJ SOLN
INTRAMUSCULAR | Status: DC | PRN
  Administered 2012-12-07: 1 mg via INTRAVENOUS

## 2012-12-07 MED ORDER — GLYCOPYRROLATE 0.2 MG/ML IJ SOLN
INTRAMUSCULAR | Status: DC | PRN
  Administered 2012-12-07: 0.6 mg via INTRAVENOUS

## 2012-12-07 MED ORDER — HYDROMORPHONE HCL PF 1 MG/ML IJ SOLN
0.2500 mg | INTRAMUSCULAR | Status: DC | PRN
  Administered 2012-12-07 (×2): 0.5 mg via INTRAVENOUS

## 2012-12-07 MED ORDER — PROMETHAZINE HCL 25 MG/ML IJ SOLN: 6.2500 mg | INTRAMUSCULAR | Status: DC | PRN

## 2012-12-07 MED ORDER — DEXTROSE 5 % IV SOLN
INTRAVENOUS | Status: AC
  Filled 2012-12-07 (×2): qty 1

## 2012-12-07 MED ORDER — DEXAMETHASONE SODIUM PHOSPHATE 10 MG/ML IJ SOLN
INTRAMUSCULAR | Status: DC | PRN
  Administered 2012-12-07: 10 mg via INTRAVENOUS

## 2012-12-07 MED ORDER — PROPOFOL 10 MG/ML IV BOLUS
INTRAVENOUS | Status: DC | PRN
  Administered 2012-12-07: 200 mg via INTRAVENOUS
  Administered 2012-12-07: 100 mg via INTRAVENOUS

## 2012-12-07 SURGICAL SUPPLY — 47 items
APPLICATOR COTTON TIP 6IN STRL (MISCELLANEOUS) IMPLANT
BLADE EXTENDED COATED 6.5IN (ELECTRODE) ×2 IMPLANT
BLADE HEX COATED 2.75 (ELECTRODE) ×4 IMPLANT
CANISTER SUCTION 2500CC (MISCELLANEOUS) ×2 IMPLANT
CHLORAPREP W/TINT 26ML (MISCELLANEOUS) ×2 IMPLANT
CLOTH BEACON ORANGE TIMEOUT ST (SAFETY) ×2 IMPLANT
COVER MAYO STAND STRL (DRAPES) IMPLANT
DRAPE LAPAROSCOPIC ABDOMINAL (DRAPES) ×2 IMPLANT
DRAPE WARM FLUID 44X44 (DRAPE) ×2 IMPLANT
ELECT REM PT RETURN 9FT ADLT (ELECTROSURGICAL) ×2
ELECTRODE REM PT RTRN 9FT ADLT (ELECTROSURGICAL) ×1 IMPLANT
GLOVE BIO SURGEON STRL SZ 6.5 (GLOVE) ×4 IMPLANT
GLOVE BIO SURGEON STRL SZ7 (GLOVE) IMPLANT
GLOVE BIOGEL PI IND STRL 7.0 (GLOVE) ×3 IMPLANT
GLOVE BIOGEL PI IND STRL 7.5 (GLOVE) ×3 IMPLANT
GLOVE BIOGEL PI INDICATOR 7.0 (GLOVE) ×3
GLOVE BIOGEL PI INDICATOR 7.5 (GLOVE) ×3
GLOVE SS BIOGEL STRL SZ 7 (GLOVE) ×2 IMPLANT
GLOVE SUPERSENSE BIOGEL SZ 7 (GLOVE) ×2
GLOVE SURG SS PI 7.5 STRL IVOR (GLOVE) ×4 IMPLANT
GOWN PREVENTION PLUS LG XLONG (DISPOSABLE) IMPLANT
GOWN PREVENTION PLUS XXLARGE (GOWN DISPOSABLE) ×2 IMPLANT
GOWN STRL REIN XL XLG (GOWN DISPOSABLE) ×4 IMPLANT
KIT BASIN OR (CUSTOM PROCEDURE TRAY) ×2 IMPLANT
LIGASURE IMPACT 36 18CM CVD LR (INSTRUMENTS) IMPLANT
NS IRRIG 1000ML POUR BTL (IV SOLUTION) ×4 IMPLANT
PACK GENERAL/GYN (CUSTOM PROCEDURE TRAY) ×2 IMPLANT
SCALPEL HARMONIC ACE (MISCELLANEOUS) IMPLANT
SHEARS FOC LG CVD HARMONIC 17C (MISCELLANEOUS) IMPLANT
SPONGE GAUZE 4X4 12PLY (GAUZE/BANDAGES/DRESSINGS) ×2 IMPLANT
SPONGE LAP 18X18 X RAY DECT (DISPOSABLE) ×2 IMPLANT
STAPLER VISISTAT 35W (STAPLE) ×2 IMPLANT
SUCTION POOLE TIP (SUCTIONS) ×2 IMPLANT
SUT ETHILON 2 0 PS N (SUTURE) ×2 IMPLANT
SUT PDS AB 1 CTX 36 (SUTURE) IMPLANT
SUT PDS AB 1 TP1 96 (SUTURE) ×4 IMPLANT
SUT SILK 2 0 (SUTURE) ×1
SUT SILK 2 0 SH CR/8 (SUTURE) ×2 IMPLANT
SUT SILK 2-0 18XBRD TIE 12 (SUTURE) ×1 IMPLANT
SUT SILK 3 0 (SUTURE) ×1
SUT SILK 3 0 SH CR/8 (SUTURE) ×2 IMPLANT
SUT SILK 3-0 18XBRD TIE 12 (SUTURE) ×1 IMPLANT
SUT VIC AB 2-0 SH 18 (SUTURE) ×2 IMPLANT
TOWEL OR 17X26 10 PK STRL BLUE (TOWEL DISPOSABLE) ×2 IMPLANT
TRAY FOLEY CATH 14FRSI W/METER (CATHETERS) IMPLANT
TRAY FOLEY METER SIL LF 16FR (CATHETERS) ×2 IMPLANT
YANKAUER SUCT BULB TIP NO VENT (SUCTIONS) IMPLANT

## 2012-12-07 NOTE — Transfer of Care (Signed)
Immediate Anesthesia Transfer of Care Note  Patient: Gregory Castro  Procedure(s) Performed: Procedure(s): EXPLORATORY LAPAROTOMY LYSIS OF ADHESIONS, and a reduction of internal hernia (N/A)  Patient Location: PACU  Anesthesia Type:General  Level of Consciousness: awake, alert , sedated and patient cooperative  Airway & Oxygen Therapy: Patient Spontanous Breathing and Patient connected to face mask oxygen  Post-op Assessment: Report given to PACU RN, Post -op Vital signs reviewed and stable and Patient moving all extremities X 4  Post vital signs: stable  Complications: No apparent anesthesia complications

## 2012-12-07 NOTE — Anesthesia Postprocedure Evaluation (Signed)
  Anesthesia Post-op Note  Patient: Gregory Castro  Procedure(s) Performed: Procedure(s) (LRB): EXPLORATORY LAPAROTOMY LYSIS OF ADHESIONS, and a reduction of internal hernia (N/A)  Patient Location: PACU  Anesthesia Type: General  Level of Consciousness: awake and alert   Airway and Oxygen Therapy: Patient Spontanous Breathing  Post-op Pain: mild  Post-op Assessment: Post-op Vital signs reviewed, Patient's Cardiovascular Status Stable, Respiratory Function Stable, Patent Airway and No signs of Nausea or vomiting  Last Vitals:  Filed Vitals:   12/07/12 1629  BP: 141/91  Pulse: 74  Temp: 36.7 C  Resp:     Post-op Vital Signs: stable   Complications: No apparent anesthesia complications

## 2012-12-07 NOTE — Progress Notes (Signed)
Patient ID: Gregory Castro, male   DOB: [birth year], 42 y.o.   MRN: [id number]    Subjective: Patient not feeling any better,  Still with LUQ abdominal pain.  No flatus.  Objective: Vital signs in last 24 hours: Temp:  [98.1 F (36.7 C)-99.2 F (37.3 C)] 98.2 F (36.8 C) (10/30 1015) Pulse Rate:  [64-78] 65 (10/30 1015) Resp:  [16-20] 18 (10/30 1015) BP: (104-137)/(62-92) 123/80 mmHg (10/30 1015) SpO2:  [97 %-98 %] 97 % (10/30 1015) Last BM Date: 12/02/12  Intake/Output from previous day: 10/29 0701 - 10/30 0700 In: 2877.1 [I.V.:2877.1] Out: 1025 [Urine:525; Emesis/NG output:500] Intake/Output this shift:    PE: Abd: soft, tender in LUQ with some mild distention, NGT with minimal bilious output. Few BS Heart: regular Lungs: CTAB  Lab Results:   Recent Labs  12/05/12 1755 12/06/12 0450  WBC 9.7 6.6  HGB 15.1 13.9  HCT 42.3 38.7*  PLT 156 193   BMET  Recent Labs  12/05/12 1755 12/06/12 0450  NA 138 137  K 4.1 3.9  CL 103 104  CO2 26 26  GLUCOSE 92 119*  BUN 8 7  CREATININE 1.10 1.14  CALCIUM 8.8 8.5   PT/INR No results found for this basename: LABPROT, INR,  in the last 72 hours CMP     Component Value Date/Time   NA 137 12/06/2012 0450   K 3.9 12/06/2012 0450   CL 104 12/06/2012 0450   CO2 26 12/06/2012 0450   GLUCOSE 119* 12/06/2012 0450   BUN 7 12/06/2012 0450   CREATININE 1.14 12/06/2012 0450   CALCIUM 8.5 12/06/2012 0450   PROT 7.0 12/05/2012 1755   ALBUMIN 3.7 12/05/2012 1755   AST 19 12/05/2012 1755   ALT 14 12/05/2012 1755   ALKPHOS 76 12/05/2012 1755   BILITOT 0.6 12/05/2012 1755   GFRNONAA 78* 12/06/2012 0450   GFRAA >90 12/06/2012 0450   Lipase     Component Value Date/Time   LIPASE 17 12/05/2012 1755       Studies/Results: Ct Abdomen Pelvis W Contrast  12/05/2012   CLINICAL DATA:  Sharp abdominal pain, history of small-bowel obstruction  EXAM: CT ABDOMEN AND PELVIS WITH CONTRAST  TECHNIQUE: Multidetector CT imaging of the  abdomen and pelvis was performed using the standard protocol following bolus administration of intravenous contrast.  CONTRAST:  OMNIPAQUE IOHEXOL 300 MG/ML  SOLN  COMPARISON:  06/29/2012  FINDINGS: Minor dependent basilar atelectasis. Normal heart size. No pericardial or pleural effusion. No hiatal hernia.  Abdomen: Fluid filled dilated loops of small bowel in the left abdomen diffusely compatible with a small bowel obstruction in the jejunum. Small amount left upper quadrant free fluid and mesenteric edema/ vascular congestion. No evidence of perforation or free air. Dilated loops are angulated. This could be related to adhesions from prior abdominal surgery versus an internal hernia. No evidence of definite mass or adenopathy.  Liver, gallbladder, biliary system, pancreas, spleen, adrenal glands, and kidneys are within normal limits for age and demonstrate no acute process. Stool noted in the right colon diffusely. Distal colon is decompressed.  No fluid collection, hemorrhage, abscess or adenopathy.  Pelvis: Small amount of pelvic free fluid. Urinary bladder unremarkable. Distal small bowel is completely collapsed. No fluid collection, hemorrhage, adenopathy, inguinal abnormality, or hernia.  No osseous abnormality.  IMPRESSION: Left upper quadrant small bowel obstruction pattern within the jejunum, suspect related to adhesions or possibly an internal hernia.  Negative for free air or an abscess  Small amount of  abdominal and pelvic free fluid.   Electronically Signed   By: Ruel Favors M.D.   On: 12/05/2012 19:37   Dg Abd Acute W/chest  12/05/2012   CLINICAL DATA:  abdomen pain with nausea and vomiting  EXAM: ACUTE ABDOMEN SERIES (ABDOMEN 2 VIEW & CHEST 1 VIEW)  COMPARISON:  November 27, 2012  FINDINGS: PA chest: Lungs are clear. Heart size and pulmonary vascularity are normal. No adenopathy.  Supine and upright abdomen: There is moderate stool throughout the colon. There is a loop of mildly dilated  small bowel in the left mid abdomen. There are several air-fluid levels. No free air. There is a surgical clip in the right lower quadrant.  IMPRESSION: Bowel gas pattern is suggestive of enteritis or early ileus. Obstruction is felt to be less likely. No free air. No edema or consolidation.   Electronically Signed   By: Bretta Bang M.D.   On: 12/05/2012 18:15   Dg Abd Portable 2v  12/07/2012   CLINICAL DATA:  Small bowel obstruction  EXAM: PORTABLE ABDOMEN - 2 VIEW  COMPARISON:  12/05/2012 abdominal radiographs and CT abdomen/pelvis  FINDINGS: Tip of nasogastric tube projects over distal gastric antrum/pylorus.  Increased dilatation of proximal small bowel loops with upper normal fold thickness.  Distal small bowel and colon appear decompressed.  Stool noted in right colon.  Tiny amount of gas in rectum.  No definite free intraperitoneal air.  Visualized lung bases clear.  Bones unremarkable.  No definite urinary tract calcification.  IMPRESSION: Persistent small bowel obstruction with increased small bowel dilatation since the previous exam.   Electronically Signed   By: Ulyses Southward M.D.   On: 12/07/2012 08:05    Anti-infectives: Anti-infectives   Start     Dose/Rate Route Frequency Ordered Stop   12/07/12 1030  cefOXitin (MEFOXIN) 2 g in dextrose 5 % 50 mL IVPB     2 g 100 mL/hr over 30 Minutes Intravenous On call to O.R. 12/07/12 1027 12/08/12 0559       Assessment/Plan  1. SBO  Plan: 1. Films showing stable to worsening SBO.  Given concerns on CT scan for possible closed loop, we will plan for OR today for laparotomy with LOA 2.  I have thoroughly d/w the patient and his family.  They all understand and agree to proceed.   LOS: 2 days    OSBORNE,KELLY E 12/07/2012, 10:27 AM Pager: [phone] Symptomatically no better.  Abdomen no better as well.  Remains AF and normal HR but he has not shown any improvement with nonop management.  I have recommended exploratory laparotomy and  LOA with possible bowel resection to evaluate for cause of obstruction and pain.  Risks of infection,bleeding, pain, scarring, recurrence, need for bowel resection, bowel injury, and anastamotic leak discussed with the patient and he expressed understanding and desires to proceed with planned procedure.  Will take to OR asap.

## 2012-12-07 NOTE — Anesthesia Preprocedure Evaluation (Signed)
Anesthesia Evaluation  Patient identified by MRN, date of birth, ID band Patient awake  General Assessment Comment:.  Bronchitis     .  Asthma     .  SBO (small bowel obstruction)     .  Gastroenteritis     Reviewed: Allergy & Precautions, H&P , NPO status , Patient's Chart, lab work & pertinent test results  Airway Mallampati: II TM Distance: >3 FB Neck ROM: Full    Dental no notable dental hx.    Pulmonary asthma ,  breath sounds clear to auscultation  Pulmonary exam normal       Cardiovascular Exercise Tolerance: Good negative cardio ROS  Rhythm:Regular Rate:Normal     Neuro/Psych negative neurological ROS  negative psych ROS   GI/Hepatic negative GI ROS, Neg liver ROS,   Endo/Other  negative endocrine ROS  Renal/GU negative Renal ROS  negative genitourinary   Musculoskeletal negative musculoskeletal ROS (+)   Abdominal   Peds negative pediatric ROS (+)  Hematology negative hematology ROS (+)   Anesthesia Other Findings   Reproductive/Obstetrics negative OB ROS                           Anesthesia Physical Anesthesia Plan  ASA: II and emergent  Anesthesia Plan: General   Post-op Pain Management:    Induction: Intravenous  Airway Management Planned: Oral ETT  Additional Equipment:   Intra-op Plan:   Post-operative Plan: Extubation in OR  Informed Consent: I have reviewed the patients History and Physical, chart, labs and discussed the procedure including the risks, benefits and alternatives for the proposed anesthesia with the patient or authorized representative who has indicated his/her understanding and acceptance.   Dental advisory given  Plan Discussed with: CRNA  Anesthesia Plan Comments:         Anesthesia Quick Evaluation

## 2012-12-08 ENCOUNTER — Encounter (HOSPITAL_COMMUNITY): Payer: Self-pay | Admitting: General Surgery

## 2012-12-08 MED ORDER — MORPHINE SULFATE (PF) 1 MG/ML IV SOLN
INTRAVENOUS | Status: DC
  Administered 2012-12-08: 19:00:00 via INTRAVENOUS
  Administered 2012-12-09: 14.92 mg via INTRAVENOUS
  Administered 2012-12-09: 4.5 mg via INTRAVENOUS
  Administered 2012-12-09: 9 mg via INTRAVENOUS
  Administered 2012-12-09: 19.3 mg via INTRAVENOUS
  Administered 2012-12-09: 13:00:00 via INTRAVENOUS
  Administered 2012-12-09: 9 mg via INTRAVENOUS
  Filled 2012-12-08 (×3): qty 25

## 2012-12-08 MED ORDER — DIPHENHYDRAMINE HCL 50 MG/ML IJ SOLN: 12.5000 mg | Freq: Four times a day (QID) | INTRAMUSCULAR | Status: DC | PRN

## 2012-12-08 MED ORDER — SODIUM CHLORIDE 0.9 % IJ SOLN: 9.0000 mL | INTRAMUSCULAR | Status: DC | PRN

## 2012-12-08 MED ORDER — PANTOPRAZOLE SODIUM 40 MG IV SOLR
40.0000 mg | INTRAVENOUS | Status: DC
  Administered 2012-12-08 – 2012-12-16 (×9): 40 mg via INTRAVENOUS
  Filled 2012-12-08 (×9): qty 40

## 2012-12-08 MED ORDER — BIOTENE DRY MOUTH MT LIQD
15.0000 mL | Freq: Two times a day (BID) | OROMUCOSAL | Status: DC
  Administered 2012-12-08 – 2012-12-19 (×22): 15 mL via OROMUCOSAL

## 2012-12-08 MED ORDER — NALOXONE HCL 0.4 MG/ML IJ SOLN: 0.4000 mg | INTRAMUSCULAR | Status: DC | PRN

## 2012-12-08 MED ORDER — CHLORHEXIDINE GLUCONATE 0.12 % MT SOLN
15.0000 mL | Freq: Two times a day (BID) | OROMUCOSAL | Status: DC
  Administered 2012-12-08 – 2012-12-19 (×15): 15 mL via OROMUCOSAL
  Filled 2012-12-08 (×24): qty 15

## 2012-12-08 MED ORDER — DIPHENHYDRAMINE HCL 12.5 MG/5ML PO ELIX: 12.5000 mg | ORAL_SOLUTION | Freq: Four times a day (QID) | ORAL | Status: DC | PRN

## 2012-12-08 NOTE — Op Note (Signed)
NAMEMILEN, LENGACHER NO.:  [id number]  MEDICAL RECORD NO.:  [id number]  LOCATION:  1531                         FACILITY:  [facility]  PHYSICIAN:  [name], MD       DATE OF BIRTH:  1970-04-22  DATE OF PROCEDURE:  12/07/2012 DATE OF DISCHARGE:                              OPERATIVE REPORT   PROCEDURE:  Exploratory laparotomy with lysis of adhesions and reduction of internal hernia.  PREOPERATIVE DIAGNOSIS:  Bowel obstruction.  POSTOPERATIVE DIAGNOSIS:  Bowel obstruction.  SURGEON:  [name], MD  ASSISTANT:  Dr. Maisie Fus.  ANESTHESIA:  General endotracheal anesthesia.  FLUIDS:  1800 mL of crystalloid.  ESTIMATED BLOOD LOSS:  100 mL.  DRAINS:  None.  SPECIMENS:  None.  COMPLICATIONS:  None apparent.  FINDINGS:  Extensive smaller adhesions to the abdominal wall as well as interloop adhesions.  The internal hernia involving several loops of small intestine from adhesions.  INDICATION FOR THE PROCEDURE:  Mr. Krempasky has been in the hospital for 2 days with no improvement with nonoperative management of a suspected bowel obstruction despite NG tube decompression and bowel rest.  OPERATIVE DETAILS:  Mr. Roam was seen and evaluated in the preoperative area.  Risks and benefits of the procedure were discussed. Informed consent was obtained.  He was taken to the operating room, placed on the table in a supine position and general endotracheal anesthesia obtained.  His abdomen was prepped and draped in a standard surgical fashion.  Procedure time-out was performed with all operative team members to confirm proper the patient and procedure.  I excised this prior midline scar and dissected down to the subcutaneous tissue using Bovie electrocautery.  The fascia was elevated and sharply incised.  The peritoneum was entered and the midline was slowly opened, taking care to avoid injury to the underlying contents.  He had several loops of small bowel,  adhered to the abdominal wall bilaterally as well as multiple interloop adhesions.  The adhesions were taken down slowly with sharp dissection.  We spent over an hour with adhesiolysis.  We were able to take down all the adhesions from the abdominal wall and then we started to work on separating all of the interloop adhesions. Most of the small bowel was adhesed to each other.  Starting at the ileocecal valve, we ran the bowel proximally.  The distal small bowel was decompressed and we took down all the adhesions, eventually came to the conglomeration of dilated small intestines in the left upper quadrant.  He had an injury to adhesive band, in which some other small bowel was twisted up onto the band forming an internal hernia.  The bowel was dilated, but nonischemic.  We took down all these adhesions in the left upper quadrant and the interloop adhesions, and we were able to eventually run the small intestine from the ligament of Treitz all the way to the ileocecal valve.  We did identify a clear transition point associated with the internal hernia.  There was 1 small serosal injury, spleen serosal tear, which was oversewn with interrupted Vicryl Lembert sutures.  Again ran the entire small bowel and there was no evidence of any  bowel injury.  The abdomen was inspected for hemostasis and the abdomen was irrigated with sterile saline solution.  NG tube was confirmed to be in the proper position and we then closed the abdomen with #1 PDS sutures, taking care to avoid injury to the underlying bowel contents.  The fascia was approximated and the wound was irrigated and skin edges were approximated with skin staples.  I did use some vertical mattress nylon sutures around the umbilicus.  All sponge, needle, and instrument counts were correct at the end of the case.  Sterile dressing was applied.  The patient tolerated the procedure well and was stable and ready for transfer to the recovery  room in stable condition.          ______________________________ [name], MD     BL/MEDQ  D:  12/07/2012  T:  12/08/2012  Job:  [id number]

## 2012-12-08 NOTE — Progress Notes (Signed)
Patient ID: Loron Weimer, male   DOB: 1970-04-30, 42 y.o.   MRN: [id number] 1 Day Post-Op  Subjective: Pt feels ok.  No flatus, some pain  Objective: Vital signs in last 24 hours: Temp:  [97.8 F (36.6 C)-99.7 F (37.6 C)] 99.2 F (37.3 C) (10/31 0943) Pulse Rate:  [66-92] 78 (10/31 0943) Resp:  [11-18] 16 (10/31 0943) BP: (118-150)/(76-91) 133/76 mmHg (10/31 0943) SpO2:  [98 %-100 %] 98 % (10/31 0943) Last BM Date: 12/02/12  Intake/Output from previous day: 10/30 0701 - 10/31 0700 In: 4037.5 [I.V.:3837.5; NG/GT:200] Out: 3825 [Urine:3525; Emesis/NG output:200; Blood:100] Intake/Output this shift: Total I/O In: 474.4 [P.O.:100; I.V.:344.4; NG/GT:30] Out: 700 [Urine:550; Emesis/NG output:150]  PE: Abd: soft, appropriately tender, -BS, ND, incision is dressed and is clean.  NGT with bilious output  Lab Results:   Recent Labs  12/05/12 1755 12/06/12 0450  WBC 9.7 6.6  HGB 15.1 13.9  HCT 42.3 38.7*  PLT 156 193   BMET  Recent Labs  12/05/12 1755 12/06/12 0450  NA 138 137  K 4.1 3.9  CL 103 104  CO2 26 26  GLUCOSE 92 119*  BUN 8 7  CREATININE 1.10 1.14  CALCIUM 8.8 8.5   PT/INR No results found for this basename: LABPROT, INR,  in the last 72 hours CMP     Component Value Date/Time   NA 137 12/06/2012 0450   K 3.9 12/06/2012 0450   CL 104 12/06/2012 0450   CO2 26 12/06/2012 0450   GLUCOSE 119* 12/06/2012 0450   BUN 7 12/06/2012 0450   CREATININE 1.14 12/06/2012 0450   CALCIUM 8.5 12/06/2012 0450   PROT 7.0 12/05/2012 1755   ALBUMIN 3.7 12/05/2012 1755   AST 19 12/05/2012 1755   ALT 14 12/05/2012 1755   ALKPHOS 76 12/05/2012 1755   BILITOT 0.6 12/05/2012 1755   GFRNONAA 78* 12/06/2012 0450   GFRAA >90 12/06/2012 0450   Lipase     Component Value Date/Time   LIPASE 17 12/05/2012 1755       Studies/Results: Dg Abd Portable 2v  12/07/2012   CLINICAL DATA:  Small bowel obstruction  EXAM: PORTABLE ABDOMEN - 2 VIEW  COMPARISON:  12/05/2012  abdominal radiographs and CT abdomen/pelvis  FINDINGS: Tip of nasogastric tube projects over distal gastric antrum/pylorus.  Increased dilatation of proximal small bowel loops with upper normal fold thickness.  Distal small bowel and colon appear decompressed.  Stool noted in right colon.  Tiny amount of gas in rectum.  No definite free intraperitoneal air.  Visualized lung bases clear.  Bones unremarkable.  No definite urinary tract calcification.  IMPRESSION: Persistent small bowel obstruction with increased small bowel dilatation since the previous exam.   Electronically Signed   By: Ulyses Southward M.D.   On: 12/07/2012 08:05    Anti-infectives: Anti-infectives   Start     Dose/Rate Route Frequency Ordered Stop   12/07/12 1030  [MAR Hold]  cefOXitin (MEFOXIN) 2 g in dextrose 5 % 50 mL IVPB     (On MAR Hold since 12/07/12 1126)   2 g 100 mL/hr over 30 Minutes Intravenous On call to O.R. 12/07/12 1027 12/07/12 1318       Assessment/Plan  1. SBO, s/p ex lap with LOA 2. Post op ileus  Plan: 1. Cont NGT and await bowel function 2. Mobilize 3. pulm toilet   LOS: 3 days    OSBORNE,KELLY E 12/08/2012, 12:10 PM Pager: [phone] Feels better.  Ng still with dark green output.  Change to PCA and add protonix.  Await return of bowel function.

## 2012-12-09 MED ORDER — KETOROLAC TROMETHAMINE 15 MG/ML IJ SOLN
15.0000 mg | Freq: Three times a day (TID) | INTRAMUSCULAR | Status: AC
  Administered 2012-12-09 – 2012-12-11 (×6): 30 mg via INTRAVENOUS
  Filled 2012-12-09 (×8): qty 2

## 2012-12-09 NOTE — Progress Notes (Signed)
In report the Day shift RN informed me of a change in color of the pt's NG output. The patient started having bloody output today. The MD on call was notified and recommended that we flush the NG tube with saline. Will continue to monitory.

## 2012-12-09 NOTE — Progress Notes (Signed)
2 Days Post-Op  Subjective: No n/v. No flatus. Still very sore and tender. Walked twice yesterday  Objective: Vital signs in last 24 hours: Temp:  [97.7 F (36.5 C)-99.8 F (37.7 C)] 98.9 F (37.2 C) (11/01 0440) Pulse Rate:  [65-78] 72 (11/01 0440) Resp:  [9-16] 12 (11/01 0735) BP: (115-133)/(75-82) 116/75 mmHg (11/01 0440) SpO2:  [97 %-100 %] 98 % (11/01 0735) Last BM Date: 12/02/12  Intake/Output from previous day: 10/31 0701 - 11/01 0700 In: 3043 [P.O.:100; I.V.:2883; NG/GT:60] Out: 1952 [Urine:1602; Emesis/NG output:350] Intake/Output this shift:    Alert, nad; NG bilious cta b/l Reg Soft, nd. Very hypoBS. Bloody drainage on gauze. Incision ok - staples intact along with suture at umbilicus skin closure No edema  Lab Results:  No results found for this basename: WBC, HGB, HCT, PLT,  in the last 72 hours BMET No results found for this basename: NA, K, CL, CO2, GLUCOSE, BUN, CREATININE, CALCIUM,  in the last 72 hours PT/INR No results found for this basename: LABPROT, INR,  in the last 72 hours ABG No results found for this basename: PHART, PCO2, PO2, HCO3,  in the last 72 hours  Studies/Results: No results found.  Anti-infectives: Anti-infectives   Start     Dose/Rate Route Frequency Ordered Stop   12/07/12 1030  [MAR Hold]  cefOXitin (MEFOXIN) 2 g in dextrose 5 % 50 mL IVPB     (On MAR Hold since 12/07/12 1126)   2 g 100 mL/hr over 30 Minutes Intravenous On call to O.R. 12/07/12 1027 12/07/12 1318      Assessment/Plan: s/p Procedure(s): EXPLORATORY LAPAROTOMY LYSIS OF ADHESIONS, and a reduction of internal hernia (N/A) POD 2  OOB, ambulate Cont bowel rest, ng tube to LIWS - about 300cc/24hrs. If output remains low, consider clamping trial Sunday ivf Cont VTE prophylaxis Cont PCA, change toradol to scheduled for pain control Repeat labs in am  [name]. [name], MD, FACS General, Bariatric, & Minimally Invasive Surgery [facility],  [place]    LOS: 4 days    Atilano Ina 12/09/2012

## 2012-12-10 LAB — BASIC METABOLIC PANEL
CO2: 28 mEq/L (ref 19–32)
Calcium: 9.4 mg/dL (ref 8.4–10.5)
Creatinine, Ser: 1.21 mg/dL (ref 0.50–1.35)
GFR calc Af Amer: 84 mL/min — ABNORMAL LOW (ref >90)
GFR calc non Af Amer: 72 mL/min — ABNORMAL LOW (ref >90)
Potassium: 4 mEq/L (ref 3.5–5.1)

## 2012-12-10 LAB — CBC
HCT: 35.7 % — ABNORMAL LOW (ref 39.0–52.0)
MCHC: 35 g/dL (ref 30.0–36.0)
Platelets: 148 10*3/uL — ABNORMAL LOW (ref 150–400)
RDW: 12.5 % (ref 11.5–15.5)

## 2012-12-10 LAB — MAGNESIUM: Magnesium: 1.9 mg/dL (ref 1.5–2.5)

## 2012-12-10 MED ORDER — DIPHENHYDRAMINE HCL 50 MG/ML IJ SOLN: 12.5000 mg | Freq: Four times a day (QID) | INTRAMUSCULAR | Status: DC | PRN

## 2012-12-10 MED ORDER — DIPHENHYDRAMINE HCL 12.5 MG/5ML PO ELIX: 12.5000 mg | ORAL_SOLUTION | Freq: Four times a day (QID) | ORAL | Status: DC | PRN

## 2012-12-10 MED ORDER — HYDROMORPHONE 0.3 MG/ML IV SOLN
INTRAVENOUS | Status: DC
  Administered 2012-12-10: 1.2 mg via INTRAVENOUS
  Administered 2012-12-10: 1.8 mg via INTRAVENOUS
  Administered 2012-12-10: 10:00:00 via INTRAVENOUS
  Administered 2012-12-10 – 2012-12-11 (×2): 0.6 mg via INTRAVENOUS
  Administered 2012-12-11: 0.9 mg via INTRAVENOUS
  Filled 2012-12-10: qty 25

## 2012-12-10 MED ORDER — ONDANSETRON HCL 4 MG/2ML IJ SOLN: 4.0000 mg | Freq: Four times a day (QID) | INTRAMUSCULAR | Status: DC | PRN

## 2012-12-10 MED ORDER — SODIUM CHLORIDE 0.9 % IJ SOLN: 9.0000 mL | INTRAMUSCULAR | Status: DC | PRN

## 2012-12-10 MED ORDER — NALOXONE HCL 0.4 MG/ML IJ SOLN: 0.4000 mg | INTRAMUSCULAR | Status: DC | PRN

## 2012-12-10 MED ORDER — HYDROMORPHONE 0.3 MG/ML IV SOLN: INTRAVENOUS | Status: DC

## 2012-12-10 NOTE — Progress Notes (Signed)
3 Days Post-Op  Subjective: Some flatus. Walked in halls. Pain better. 1350NG!! - bilious  Objective: Vital signs in last 24 hours: Temp:  [97.7 F (36.5 C)-98.5 F (36.9 C)] 97.7 F (36.5 C) (11/02 0545) Pulse Rate:  [57-77] 57 (11/02 0545) Resp:  [10-26] 18 (11/02 0545) BP: (107-158)/(71-83) 107/71 mmHg (11/02 0545) SpO2:  [97 %-100 %] 99 % (11/02 0545) Last BM Date: 12/02/12  Intake/Output from previous day: 11/01 0701 - 11/02 0700 In: 3001 [P.O.:1; I.V.:3000] Out: 4350 [Urine:3000; Emesis/NG output:1350] Intake/Output this shift:    Asleep, easily awakes cta b/l Reg Soft, mild distension, no BS.  No edema  Lab Results:   Recent Labs  12/10/12 0520  WBC 3.2*  HGB 12.5*  HCT 35.7*  PLT 148*   BMET  Recent Labs  12/10/12 0520  NA 133*  K 4.0  CL 99  CO2 28  GLUCOSE 114*  BUN 8  CREATININE 1.21  CALCIUM 9.4   PT/INR No results found for this basename: LABPROT, INR,  in the last 72 hours ABG No results found for this basename: PHART, PCO2, PO2, HCO3,  in the last 72 hours  Studies/Results: No results found.  Anti-infectives: Anti-infectives   Start     Dose/Rate Route Frequency Ordered Stop   12/07/12 1030  [MAR Hold]  cefOXitin (MEFOXIN) 2 g in dextrose 5 % 50 mL IVPB     (On MAR Hold since 12/07/12 1126)   2 g 100 mL/hr over 30 Minutes Intravenous On call to O.R. 12/07/12 1027 12/07/12 1318      Assessment/Plan: s/p Procedure(s): EXPLORATORY LAPAROTOMY LYSIS OF ADHESIONS, and a reduction of internal hernia (N/A) Cont bowel rest given large bilious NG output - not ready for clamping trials; NG to LIWS Cont IVF OOB, ambulate Cont VTE prophylaxis Labs ok  [name]. [name], MD, FACS General, Bariatric, & Minimally Invasive Surgery [facility], [place]   LOS: 5 days    Gregory Castro 12/10/2012

## 2012-12-10 NOTE — Progress Notes (Signed)
Justin,NT recently in to change NGT cannister. NT noted small, chunks in cannister fluid. Pt admitted to [facility] having eaten a few gummy bears and some chips. Pt instructed and advised to adhere to ice chips only as ordered. Explained to pt how deviating from regimen could result in set back with verbalized understanding. Stated "i won't eat again."

## 2012-12-11 ENCOUNTER — Encounter (HOSPITAL_COMMUNITY): Payer: Self-pay | Admitting: General Surgery

## 2012-12-11 DIAGNOSIS — K567 Ileus, unspecified: Secondary | ICD-10-CM | POA: Clinically undetermined

## 2012-12-11 LAB — BASIC METABOLIC PANEL
BUN: 8 mg/dL (ref 6–23)
Calcium: 9.1 mg/dL (ref 8.4–10.5)
Chloride: 100 mEq/L (ref 96–112)
GFR calc Af Amer: 87 mL/min — ABNORMAL LOW (ref >90)
GFR calc non Af Amer: 75 mL/min — ABNORMAL LOW (ref >90)
Potassium: 4.2 mEq/L (ref 3.5–5.1)

## 2012-12-11 LAB — GLUCOSE, CAPILLARY: Glucose-Capillary: 113 mg/dL — ABNORMAL HIGH (ref 70–99)

## 2012-12-11 MED ORDER — HYDROMORPHONE 0.3 MG/ML IV SOLN
INTRAVENOUS | Status: DC
  Administered 2012-12-11: 2.1 mg via INTRAVENOUS
  Administered 2012-12-11: 0.6 mg via INTRAVENOUS
  Administered 2012-12-12: 0.9 mg via INTRAVENOUS
  Administered 2012-12-12: 0.3 mg via INTRAVENOUS
  Administered 2012-12-12 (×3): 0.9 mg via INTRAVENOUS
  Administered 2012-12-12: 1.2 mg via INTRAVENOUS
  Administered 2012-12-13: 1.5 mg via INTRAVENOUS
  Administered 2012-12-13: 0.3 mg via INTRAVENOUS
  Administered 2012-12-13: 1.5 mg via INTRAVENOUS
  Filled 2012-12-11 (×2): qty 25

## 2012-12-11 MED ORDER — DIPHENHYDRAMINE HCL 12.5 MG/5ML PO ELIX: 12.5000 mg | ORAL_SOLUTION | Freq: Four times a day (QID) | ORAL | Status: DC | PRN

## 2012-12-11 MED ORDER — KCL IN DEXTROSE-NACL 40-5-0.45 MEQ/L-%-% IV SOLN
INTRAVENOUS | Status: AC
  Administered 2012-12-11 – 2012-12-12 (×2): via INTRAVENOUS
  Filled 2012-12-11 (×4): qty 1000

## 2012-12-11 MED ORDER — TRACE MINERALS CR-CU-F-FE-I-MN-MO-SE-ZN IV SOLN
INTRAVENOUS | Status: AC
  Administered 2012-12-11: 18:00:00 via INTRAVENOUS
  Filled 2012-12-11: qty 1000

## 2012-12-11 MED ORDER — HYDROMORPHONE HCL PF 1 MG/ML IJ SOLN
0.5000 mg | INTRAMUSCULAR | Status: DC | PRN
  Administered 2012-12-13 – 2012-12-16 (×14): 1 mg via INTRAVENOUS
  Filled 2012-12-11 (×13): qty 1

## 2012-12-11 MED ORDER — SODIUM CHLORIDE 0.9 % IJ SOLN: 9.0000 mL | INTRAMUSCULAR | Status: DC | PRN

## 2012-12-11 MED ORDER — FAT EMULSION 20 % IV EMUL
250.0000 mL | INTRAVENOUS | Status: AC
  Administered 2012-12-11: 250 mL via INTRAVENOUS
  Filled 2012-12-11: qty 250

## 2012-12-11 MED ORDER — ONDANSETRON HCL 4 MG/2ML IJ SOLN
4.0000 mg | Freq: Four times a day (QID) | INTRAMUSCULAR | Status: DC | PRN
Start: 2012-12-11 — End: 2012-12-13

## 2012-12-11 MED ORDER — INSULIN ASPART 100 UNIT/ML ~~LOC~~ SOLN
0.0000 [IU] | SUBCUTANEOUS | Status: DC
  Administered 2012-12-11 – 2012-12-14 (×6): 1 [IU] via SUBCUTANEOUS

## 2012-12-11 MED ORDER — SODIUM CHLORIDE 0.9 % IJ SOLN
10.0000 mL | INTRAMUSCULAR | Status: DC | PRN
  Administered 2012-12-12 – 2012-12-18 (×3): 10 mL

## 2012-12-11 MED ORDER — DIPHENHYDRAMINE HCL 50 MG/ML IJ SOLN: 12.5000 mg | Freq: Four times a day (QID) | INTRAMUSCULAR | Status: DC | PRN

## 2012-12-11 MED ORDER — NALOXONE HCL 0.4 MG/ML IJ SOLN: 0.4000 mg | INTRAMUSCULAR | Status: DC | PRN

## 2012-12-11 NOTE — Progress Notes (Signed)
Peripherally Inserted Central Catheter/Midline Placement  The IV Nurse has discussed with the patient and/or persons authorized to consent for the patient, the purpose of this procedure and the potential benefits and risks involved with this procedure.  The benefits include less needle sticks, lab draws from the catheter and patient may be discharged home with the catheter.  Risks include, but not limited to, infection, bleeding, blood clot (thrombus formation), and puncture of an artery; nerve damage and irregular heat beat.  Alternatives to this procedure were also discussed.  PICC/Midline Placement Documentation  PICC / Midline Double Lumen 12/11/12 PICC Right Basilic 45 cm 2 cm (Active)  Indication for Insertion or Continuance of Line Administration of hyperosmolar/irritating solutions (i.e. TPN, Vancomycin, etc.) 12/11/2012  4:00 PM  Exposed Catheter (cm) 2 cm 12/11/2012  4:00 PM  Dressing Change Due 12/18/12 12/11/2012  4:00 PM       Stacie Glaze Horton 12/11/2012, 4:11 PM

## 2012-12-11 NOTE — Progress Notes (Signed)
4 Days Post-Op  Subjective: Says he has some flatus and he is walking using IS. Using PCA some but not much.  Objective: Vital signs in last 24 hours: Temp:  [97.8 F (36.6 C)-98.5 F (36.9 C)] 97.8 F (36.6 C) (11/03 0525) Pulse Rate:  [62-84] 62 (11/03 0525) Resp:  [10-21] 18 (11/03 0812) BP: (111-122)/(75-80) 122/78 mmHg (11/03 0525) SpO2:  [27 %-100 %] 99 % (11/03 0812) Last BM Date: 12/02/12 1650 from NG yesterday, (60 ml PO) Afebrile, VSS Labs OK Intake/Output from previous day: 11/02 0701 - 11/03 0700 In: 3060 [P.O.:60; I.V.:3000] Out: 4350 [Urine:2700; Emesis/NG output:1650] Intake/Output this shift:    General appearance: alert, cooperative and no distress Resp: clear to auscultation bilaterally GI: soft, not much in the way of bowel sounds, he reports some flatus.  Allot of ongoing NG drainage. Incision ok some skin edge bleeding.  Lab Results:   Recent Labs  12/10/12 0520  WBC 3.2*  HGB 12.5*  HCT 35.7*  PLT 148*    BMET  Recent Labs  12/10/12 0520 12/11/12 0400  NA 133* 133*  K 4.0 4.2  CL 99 100  CO2 28 26  GLUCOSE 114* 115*  BUN 8 8  CREATININE 1.21 1.17  CALCIUM 9.4 9.1   PT/INR No results found for this basename: LABPROT, INR,  in the last 72 hours   Recent Labs Lab 12/05/12 1755  AST 19  ALT 14  ALKPHOS 76  BILITOT 0.6  PROT 7.0  ALBUMIN 3.7     Lipase     Component Value Date/Time   LIPASE 17 12/05/2012 1755     Studies/Results: No results found.  Medications: . antiseptic oral rinse  15 mL Mouth Rinse BID  . chlorhexidine  15 mL Mouth Rinse BID  . enoxaparin (LOVENOX) injection  40 mg Subcutaneous Q24H  . HYDROmorphone PCA 0.3 mg/mL   Intravenous Q4H  . lidocaine  15 mL Mouth/Throat Once  . lip balm  1 application Topical BID  . pantoprazole (PROTONIX) IV  40 mg Intravenous Q24H  . sodium chloride  1,000 mL Intravenous Once    Assessment/Plan Bowel obstruction. Exploratory laparotomy with lysis of adhesions  and reduction of internal hernia. 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  SBO medical management 2013 Post op ileus Asthma Lovenox for DVT  Plan:  PICC line and start some TNA, continue to mobilize      LOS: 6 days    Maryah Marinaro 12/11/2012

## 2012-12-11 NOTE — Progress Notes (Signed)
The patient smiling. In much better spirits.  Walking much more.  Drinking some water.  Claims flatus.  Family in room.  Abdomen is soft. Dressing intact. No pain or peritonitis.  I wonder if he is drinking a lot of liquids and his falsely elevating the nasogastric output. I asked him to be sure to let the nurses know how much he is taking in suite and subtracted out. Output is thinly bilious at most, some hopefully is nearly there.  Agree with TNA given prolonged ileus and long bowel obstruction prior to admission.

## 2012-12-11 NOTE — Progress Notes (Signed)
PARENTERAL NUTRITION CONSULT NOTE - INITIAL  Pharmacy Consult for TPN Indication: Bowel obstruction, prolonged post-operative ileus  Allergies  Allergen Reactions  . Shellfish Allergy Anaphylaxis and Swelling  . Bentyl [Dicyclomine] Nausea And Vomiting    Patient Measurements: Height: 5\' 4"  (162.6 cm) Weight: 150 lb (68.04 kg) IBW/kg (Calculated) : 59.2 Adjusted Body Weight: 62 kg   Vital Signs: Temp: 97.8 F (36.6 C) (11/03 0525) Temp src: Oral (11/03 0525) BP: 122/78 mmHg (11/03 0525) Pulse Rate: 62 (11/03 0525) Intake/Output from previous day: 11/02 0701 - 11/03 0700 In: 3060 [P.O.:60; I.V.:3000] Out: 4350 [Urine:2700; Emesis/NG output:1650] Intake/Output from this shift:    Labs:  Recent Labs  12/10/12 0520  WBC 3.2*  HGB 12.5*  HCT 35.7*  PLT 148*     Recent Labs  12/10/12 0520 12/11/12 0400  NA 133* 133*  K 4.0 4.2  CL 99 100  CO2 28 26  GLUCOSE 114* 115*  BUN 8 8  CREATININE 1.21 1.17  CALCIUM 9.4 9.1  MG 1.9 1.7   Estimated Creatinine Clearance: 68.9 ml/min (by C-G formula based on Cr of 1.17).   No results found for this basename: GLUCAP,  in the last 72 hours  Medical History: Past Medical History  Diagnosis Date  . Bronchitis   . Asthma   . SBO (small bowel obstruction)   . Gastroenteritis   . Ileus, postoperative 12/11/2012    Medications:  Scheduled:  . antiseptic oral rinse  15 mL Mouth Rinse BID  . chlorhexidine  15 mL Mouth Rinse BID  . enoxaparin (LOVENOX) injection  40 mg Subcutaneous Q24H  . HYDROmorphone PCA 0.3 mg/mL   Intravenous Q4H  . lidocaine  15 mL Mouth/Throat Once  . lip balm  1 application Topical BID  . pantoprazole (PROTONIX) IV  40 mg Intravenous Q24H  . sodium chloride  1,000 mL Intravenous Once   Infusions:  . dextrose 5 % and 0.45 % NaCl with KCl 40 mEq/L 125 mL/hr at 12/11/12 1610   PRN: acetaminophen, alum & mag hydroxide-simeth, diphenhydrAMINE, diphenhydrAMINE, LORazepam, magic mouthwash,  menthol-cetylpyridinium, metoprolol, naloxone, ondansetron, ondansetron (ZOFRAN) IV, phenol, sodium chloride  Insulin Requirements in the past 24 hours:  None  Current Nutrition:  NPO except ice chips  Maintenance IVF: D-5-1/2 NS + KCl 54mEq/L at 125 mL/hr  Assessment: 42 y/o M admitted with SBO, underwent exploratory laparotomy with lysis of adhesions and reduction of internal hernia on 12/07/2012, now with prolonged postoperative ileus.  For placement of PICC and initiation of TPN today with pharmacy and dietary to assist with management.  Nutritional Goals:  As per RD assessment 12/11/2012:  1650 - 1850 kCal/d,  80-100 grams protein per day 1.6 - 1.8 L fluid per day  Goal Formula and Rate: Clinimix-E 5/15 at 75 mL/hr + Lipids 20% at 10 mL/hr to provide 1758 KCal/day, 90 grams protein/day  Plan:  At 1800 tonight: 1.  Begin Clinimix-E 5/15 at 40 mL/hr.  2.  Begin Lipids 20% at 10 mL/hr. 3.  Include standard multivitamins and trace elements in each bag TPN. 4.  Reduce maintenance IVF to 75 mL/hr. 5.  Begin CBGs q4h, cover with Novolog "sensitive" sliding scale. 6.  CMet, Magnesium, Phosphorus, Triglycerides, Prealbumin tomorrow. 7.  Routine TPN labs every Monday and Thursday. 8.  Advance Clinimix-E 5/15 as tolerated to goal rate of 75 mL/hr.  Elie Goody, PharmD, BCPS Pager: [phone] 12/11/2012  9:47 AM

## 2012-12-11 NOTE — Progress Notes (Signed)
INITIAL NUTRITION ASSESSMENT  DOCUMENTATION CODES Per approved criteria  -Not Applicable   INTERVENTION: - TPN per pharmacy - Diet advancement per MD - Will continue to monitor   NUTRITION DIAGNOSIS: Inadequate oral intake related to inability to eat as evidenced by NPO.   Goal: TPN to meet >90% of estimated nutritional needs.   Monitor:  Weights, labs, diet advancement, TPN, BM  Reason for Assessment: Consult for new TPN  42 y.o. male  Admitting Dx: SBO (small bowel obstruction)  ASSESSMENT: Admitted with abdominal pain with nausea, last BM 3 days ago. Found to have small bowel obstruction. Per surgeon's notes, pt with chronic abdominal pain and intermittent constipation with hx of numerous abdominal surgeries. NGT placed 10/29. Had exploratory laparotomy with lysis of adhesions and reduction of internal hernia 10/30. Had 1.35L bilious NGT output yesterday. Pt with hypoactive bowel sounds yesterday. Pt with post-op ileus, plan is to start TPN today. Discussed with pharmacist.   Met with pt who reports good appetite PTA, typically eats healthy, does not eat beef, eats 3 meals/day and drinks 3 Ensure/day r/t working out a lot. Reports having trouble drinking water because he only drank it during his incarceration and gets tired of it. Enjoys drinking soda with meals at home so he can burp afterwards which provides him stomach relief. Plans on improving hydration status after d/c. Denies any changes in weight. Reports NGT improved his abdominal distention. NGT output 1.65L total yesterday.   - Sodium slightly low - GFR slightly low  Height: Ht Readings from Last 1 Encounters:  12/05/12 5\' 4"  (1.626 m)    Weight: Wt Readings from Last 1 Encounters:  12/05/12 150 lb (68.04 kg)    Ideal Body Weight: 130 lb  % Ideal Body Weight: 115%  Wt Readings from Last 10 Encounters:  12/05/12 150 lb (68.04 kg)  12/05/12 150 lb (68.04 kg)    Usual Body Weight: 147-150 lb   % Usual  Body Weight: 100-102%  BMI:  Body mass index is 25.73 kg/(m^2).  Estimated Nutritional Needs: Kcal: 1650-1850 Protein: 80-100g Fluid: 1.6-1.8L/day   Skin: Abdominal incision   Diet Order: NPO  EDUCATION NEEDS: -No education needs identified at this time   Intake/Output Summary (Last 24 hours) at 12/11/12 0921 Last data filed at 12/11/12 0600  Gross per 24 hour  Intake   3060 ml  Output   4350 ml  Net  -1290 ml    Last BM: 10/25  Labs:   Recent Labs Lab 12/06/12 0450 12/10/12 0520 12/11/12 0400  NA 137 133* 133*  K 3.9 4.0 4.2  CL 104 99 100  CO2 26 28 26   BUN 7 8 8   CREATININE 1.14 1.21 1.17  CALCIUM 8.5 9.4 9.1  MG  --  1.9 1.7  GLUCOSE 119* 114* 115*    CBG (last 3)  No results found for this basename: GLUCAP,  in the last 72 hours  Scheduled Meds: . antiseptic oral rinse  15 mL Mouth Rinse BID  . chlorhexidine  15 mL Mouth Rinse BID  . enoxaparin (LOVENOX) injection  40 mg Subcutaneous Q24H  . HYDROmorphone PCA 0.3 mg/mL   Intravenous Q4H  . lidocaine  15 mL Mouth/Throat Once  . lip balm  1 application Topical BID  . pantoprazole (PROTONIX) IV  40 mg Intravenous Q24H  . sodium chloride  1,000 mL Intravenous Once    Continuous Infusions: . dextrose 5 % and 0.45 % NaCl with KCl 40 mEq/L 125 mL/hr at 12/11/12  8119    Past Medical History  Diagnosis Date  . Bronchitis   . Asthma   . SBO (small bowel obstruction)   . Gastroenteritis   . Ileus, postoperative 12/11/2012    Past Surgical History  Procedure Laterality Date  . Abdominal surgery  42 y/o    ?Malrotation?  . Bowel resection  2012?    [facility]  . Laparotomy N/A 12/07/2012    Procedure: EXPLORATORY LAPAROTOMY LYSIS OF ADHESIONS, and a reduction of internal hernia;  Surgeon: [name], DO;  Location: [facility]S;  Service: General;  Laterality: N/A;    Levon Hedger MS, RD, LDN [phone] Pager [phone] After Hours Pager

## 2012-12-12 DIAGNOSIS — E46 Unspecified protein-calorie malnutrition: Secondary | ICD-10-CM

## 2012-12-12 LAB — MAGNESIUM: Magnesium: 1.9 mg/dL (ref 1.5–2.5)

## 2012-12-12 LAB — COMPREHENSIVE METABOLIC PANEL
ALT: 11 U/L (ref 0–53)
AST: 18 U/L (ref 0–37)
Calcium: 9.2 mg/dL (ref 8.4–10.5)
Potassium: 3.9 mEq/L (ref 3.5–5.1)
Sodium: 136 mEq/L (ref 135–145)
Total Protein: 6.9 g/dL (ref 6.0–8.3)

## 2012-12-12 LAB — DIFFERENTIAL
Basophils Absolute: 0 10*3/uL (ref 0.0–0.1)
Basophils Relative: 0 % (ref 0–1)
Eosinophils Relative: 8 % — ABNORMAL HIGH (ref 0–5)
Lymphs Abs: 1 10*3/uL (ref 0.7–4.0)
Monocytes Relative: 10 % (ref 3–12)
Neutro Abs: 2.8 10*3/uL (ref 1.7–7.7)
Neutrophils Relative %: 61 % (ref 43–77)

## 2012-12-12 LAB — GLUCOSE, CAPILLARY
Glucose-Capillary: 122 mg/dL — ABNORMAL HIGH (ref 70–99)
Glucose-Capillary: 128 mg/dL — ABNORMAL HIGH (ref 70–99)
Glucose-Capillary: 90 mg/dL (ref 70–99)

## 2012-12-12 LAB — PHOSPHORUS: Phosphorus: 3.7 mg/dL (ref 2.3–4.6)

## 2012-12-12 LAB — TRIGLYCERIDES: Triglycerides: 84 mg/dL (ref <150)

## 2012-12-12 LAB — CBC
Hemoglobin: 12.6 g/dL — ABNORMAL LOW (ref 13.0–17.0)
MCHC: 36 g/dL (ref 30.0–36.0)
MCV: 82.2 fL (ref 78.0–100.0)
Platelets: 200 10*3/uL (ref 150–400)
RBC: 4.26 MIL/uL (ref 4.22–5.81)

## 2012-12-12 MED ORDER — FAT EMULSION 20 % IV EMUL
250.0000 mL | INTRAVENOUS | Status: AC
  Administered 2012-12-12: 250 mL via INTRAVENOUS
  Filled 2012-12-12: qty 250

## 2012-12-12 MED ORDER — SODIUM CHLORIDE 0.45 % IV SOLN
INTRAVENOUS | Status: AC
  Administered 2012-12-13: 07:00:00 via INTRAVENOUS
  Filled 2012-12-12 (×3): qty 1000

## 2012-12-12 MED ORDER — TRACE MINERALS CR-CU-F-FE-I-MN-MO-SE-ZN IV SOLN
INTRAVENOUS | Status: AC
  Administered 2012-12-12: 17:00:00 via INTRAVENOUS
  Filled 2012-12-12: qty 2000

## 2012-12-12 NOTE — Progress Notes (Signed)
Postoperative day #5 from open lysing adhesions for internal hernia in a patient with prior bowel obstructions.  Continue nasogastric tube and IV fluids. I agree with IV nutrition given her prolonged n.p.o. Prior to surgery and prolonged ileus.  Patient he medically stable and not tearing. Hopefully will open up soon. If clinically deteriorates, consider CT scan to rule out fluid collection etc. However, does not seem to likely at this time.

## 2012-12-12 NOTE — Progress Notes (Signed)
PARENTERAL NUTRITION CONSULT NOTE   Pharmacy Consult for TPN Indication: Bowel obstruction, prolonged post-operative ileus  Allergies  Allergen Reactions  . Shellfish Allergy Anaphylaxis and Swelling  . Bentyl [Dicyclomine] Nausea And Vomiting    Patient Measurements: Height: 5\' 4"  (162.6 cm) Weight: 150 lb (68.04 kg) IBW/kg (Calculated) : 59.2 Adjusted Body Weight: 62 kg   Vital Signs: Temp: 98.5 F (36.9 C) (11/04 0353) Temp src: Oral (11/04 0353) BP: 129/86 mmHg (11/04 0353) Pulse Rate: 72 (11/04 0353) Intake/Output from previous day: 11/03 0701 - 11/04 0700 In: 2062.8 [P.O.:360; I.V.:1124.5; NG/GT:30; TPN:548.3] Out: 2650 [Urine:1100; Emesis/NG output:1550] Intake/Output from this shift:    Labs:  Recent Labs  12/10/12 0520 12/12/12 0440  WBC 3.2* 4.6  HGB 12.5* 12.6*  HCT 35.7* 35.0*  PLT 148* 200     Recent Labs  12/10/12 0520 12/11/12 0400 12/12/12 0440  NA 133* 133* 136  K 4.0 4.2 3.9  CL 99 100 102  CO2 28 26 26   GLUCOSE 114* 115* 102*  BUN 8 8 10   CREATININE 1.21 1.17 1.13  CALCIUM 9.4 9.1 9.2  MG 1.9 1.7 1.9  PHOS  --   --  3.7  PROT  --   --  6.9  ALBUMIN  --   --  3.2*  AST  --   --  18  ALT  --   --  11  ALKPHOS  --   --  57  BILITOT  --   --  0.5  TRIG  --   --  84   Estimated Creatinine Clearance: 71.3 ml/min (by C-G formula based on Cr of 1.13).    Recent Labs  12/11/12 2322 12/12/12 0338 12/12/12 0802  GLUCAP 122* 128* 112*    Medical History: Past Medical History  Diagnosis Date  . Bronchitis   . Asthma   . SBO (small bowel obstruction)   . Gastroenteritis   . Ileus, postoperative     Medications:  Scheduled:  . antiseptic oral rinse  15 mL Mouth Rinse BID  . chlorhexidine  15 mL Mouth Rinse BID  . enoxaparin (LOVENOX) injection  40 mg Subcutaneous Q24H  . HYDROmorphone PCA 0.3 mg/mL   Intravenous Q4H  . insulin aspart  0-9 Units Subcutaneous Q4H  . lidocaine  15 mL Mouth/Throat Once  . lip balm  1  application Topical BID  . pantoprazole (PROTONIX) IV  40 mg Intravenous Q24H  . sodium chloride  1,000 mL Intravenous Once   Infusions:  . dextrose 5 % and 0.45 % NaCl with KCl 40 mEq/L 75 mL/hr at 12/11/12 2049  . Marland KitchenTPN (CLINIMIX-E) Adult 40 mL/hr at 12/11/12 1757   And  . fat emulsion 250 mL (12/11/12 1757)   PRN: acetaminophen, alum & mag hydroxide-simeth, diphenhydrAMINE, diphenhydrAMINE, HYDROmorphone (DILAUDID) injection, LORazepam, magic mouthwash, menthol-cetylpyridinium, metoprolol, naloxone, ondansetron, ondansetron (ZOFRAN) IV, phenol, sodium chloride, sodium chloride  Insulin Requirements in the past 24 hours:  None  Current Nutrition:  NPO except ice chips  Maintenance IVF: D-5-1/2 NS + KCl 44mEq/L at 75 mL/hr  Assessment: 42 y/o M admitted with SBO, underwent exploratory laparotomy with lysis of adhesions and reduction of internal hernia on 12/07/2012, now with prolonged postoperative ileus.  For placement of PICC and initiation of TPN today with pharmacy and dietary to assist with management.  11/4: D2 TPN - TPN initiated last pm   Glucose - CBGs at goal < 150mg /dl, 2 units novolog ZOX/09U  Electrolytes - WNL  LFTs - WNL  TGs - 84 (11/4)  Prealbumin - pending (11/4)  TPN Access: PICC (placed 11/3)  Nutritional Goals:  As per RD assessment 12/11/2012:  1650 - 1850 kCal/d,  80-100 grams protein per day 1.6 - 1.8 L fluid per day  Goal Formula and Rate: Clinimix-E 5/15 at 75 mL/hr + Lipids 20% at 10 mL/hr to provide 1758 KCal/day, 90 grams protein/day  Plan:  At 1800 tonight: 1.  Tolerating TPN, advance Clinimix-E 5/15 to 65 mL/hr.  2.  Continue Lipids 20% at 10 mL/hr. 3.  Include standard multivitamins and trace elements in each bag TPN. 4.  Change maintenance IVF to 1/2NS + KCl and rate 55 mL/hr. At 1800 5.  Continue CBGs q4h, cover with Novolog "sensitive" sliding scale. 6.  BMP, Mg and Phos 7.  Routine TPN labs every Monday and Thursday. 8.   Advance Clinimix-E 5/15 as tolerated to goal rate of 75 mL/hr.  Juliette Alcide, PharmD, BCPS.   Pager: [phone] 12/12/2012  9:00 AM

## 2012-12-12 NOTE — Progress Notes (Signed)
5 Days Post-Op  Subjective: No real change.  Wound is tender, he does have a few BS today.  Objective: Vital signs in last 24 hours: Temp:  [98.4 F (36.9 C)-99.3 F (37.4 C)] 98.5 F (36.9 C) (11/04 0353) Pulse Rate:  [67-86] 72 (11/04 0353) Resp:  [16-20] 18 (11/04 0353) BP: (115-129)/(76-86) 129/86 mmHg (11/04 0353) SpO2:  [96 %-99 %] 96 % (11/04 0800) Last BM Date: 12/02/12 360 PO 1550 ml per NG NO BM Afebrile, VSS Labs OK, prealbumin pending.  Intake/Output from previous day: 11/03 0701 - 11/04 0700 In: 2062.8 [P.O.:360; I.V.:1124.5; NG/GT:30; TPN:548.3] Out: 2650 [Urine:1100; Emesis/NG output:1550] Intake/Output this shift:    General appearance: alert, cooperative and no distress Resp: clear to auscultation bilaterally GI: tender, some skin line bleeding expecially mid point where he has some sutures.  I cleaned it with soap and water.  Redressed it.  Few BS today, but ongoing high NG drainage.  Lab Results:   Recent Labs  12/10/12 0520 12/12/12 0440  WBC 3.2* 4.6  HGB 12.5* 12.6*  HCT 35.7* 35.0*  PLT 148* 200    BMET  Recent Labs  12/11/12 0400 12/12/12 0440  NA 133* 136  K 4.2 3.9  CL 100 102  CO2 26 26  GLUCOSE 115* 102*  BUN 8 10  CREATININE 1.17 1.13  CALCIUM 9.1 9.2   PT/INR No results found for this basename: LABPROT, INR,  in the last 72 hours   Recent Labs Lab 12/05/12 1755 12/12/12 0440  AST 19 18  ALT 14 11  ALKPHOS 76 57  BILITOT 0.6 0.5  PROT 7.0 6.9  ALBUMIN 3.7 3.2*     Lipase     Component Value Date/Time   LIPASE 17 12/05/2012 1755     Studies/Results: No results found.  Medications: . antiseptic oral rinse  15 mL Mouth Rinse BID  . chlorhexidine  15 mL Mouth Rinse BID  . enoxaparin (LOVENOX) injection  40 mg Subcutaneous Q24H  . HYDROmorphone PCA 0.3 mg/mL   Intravenous Q4H  . insulin aspart  0-9 Units Subcutaneous Q4H  . lidocaine  15 mL Mouth/Throat Once  . lip balm  1 application Topical BID  .  pantoprazole (PROTONIX) IV  40 mg Intravenous Q24H  . sodium chloride  1,000 mL Intravenous Once   . dextrose 5 % and 0.45 % NaCl with KCl 40 mEq/L 75 mL/hr at 12/11/12 2049  . Marland KitchenTPN (CLINIMIX-E) Adult 40 mL/hr at 12/11/12 1757   And  . fat emulsion 250 mL (12/11/12 1757)    Assessment/Plan Bowel obstruction.  Exploratory laparotomy with lysis of adhesions and reduction of internal hernia. 12/08/2012, [name], DO  S/p bowel resection 2012 [place] and as a young child. SBO medical management 2013  Post op ileus  Asthma  Lovenox for DVT Malnutrition with TNA now   Plan:  We have started him on TNA for his ileus and malnutrition.  He is up walking.  Continue NG for now check film in AM.    LOS: 7 days    Nohemy Koop 12/12/2012

## 2012-12-13 ENCOUNTER — Inpatient Hospital Stay (HOSPITAL_COMMUNITY): Payer: Medicaid Other

## 2012-12-13 LAB — BASIC METABOLIC PANEL
CO2: 27 mEq/L (ref 19–32)
Calcium: 9.8 mg/dL (ref 8.4–10.5)
Chloride: 100 mEq/L (ref 96–112)
GFR calc Af Amer: 86 mL/min — ABNORMAL LOW (ref >90)
Glucose, Bld: 114 mg/dL — ABNORMAL HIGH (ref 70–99)
Sodium: 135 mEq/L (ref 135–145)

## 2012-12-13 LAB — GLUCOSE, CAPILLARY
Glucose-Capillary: 109 mg/dL — ABNORMAL HIGH (ref 70–99)
Glucose-Capillary: 93 mg/dL (ref 70–99)
Glucose-Capillary: 110 mg/dL — ABNORMAL HIGH (ref 70–99)

## 2012-12-13 LAB — MAGNESIUM: Magnesium: 2.2 mg/dL (ref 1.5–2.5)

## 2012-12-13 LAB — PHOSPHORUS: Phosphorus: 3.8 mg/dL (ref 2.3–4.6)

## 2012-12-13 MED ORDER — FAT EMULSION 20 % IV EMUL
250.0000 mL | INTRAVENOUS | Status: AC
  Administered 2012-12-13: 250 mL via INTRAVENOUS
  Filled 2012-12-13: qty 250

## 2012-12-13 MED ORDER — ACETAMINOPHEN 160 MG/5ML PO SOLN: 650.0000 mg | ORAL | Status: DC | PRN

## 2012-12-13 MED ORDER — SODIUM CHLORIDE 0.45 % IV SOLN
INTRAVENOUS | Status: DC
  Administered 2012-12-15 – 2012-12-18 (×3): via INTRAVENOUS
  Filled 2012-12-13 (×12): qty 1000

## 2012-12-13 MED ORDER — TRACE MINERALS CR-CU-F-FE-I-MN-MO-SE-ZN IV SOLN
INTRAVENOUS | Status: AC
  Administered 2012-12-13: 18:00:00 via INTRAVENOUS
  Filled 2012-12-13: qty 2000

## 2012-12-13 MED ORDER — OXYCODONE HCL 5 MG/5ML PO SOLN
10.0000 mg | ORAL | Status: DC | PRN
  Administered 2012-12-13: 10 mg via ORAL
  Filled 2012-12-13: qty 10

## 2012-12-13 NOTE — Progress Notes (Signed)
Hopefully ileus resolving. I suspect NG tube output is falsely elevated with increased by mouth liquid intake. Will test that hypothesis with NG clamping trial

## 2012-12-13 NOTE — Progress Notes (Signed)
6 Days Post-Op  Subjective: He looks like he's feeling better.  Less drainage, he's using PCA allot.    Objective: Vital signs in last 24 hours: Temp:  [98.1 F (36.7 C)-98.8 F (37.1 C)] 98.1 F (36.7 C) 12/30/22 0503) Pulse Rate:  [74-78] 78 2022-12-30 0503) Resp:  [16-18] 16 December 30, 2022 0835) BP: (100-138)/(79-96) 119/88 mmHg Dec 30, 2022 0503) SpO2:  [95 %-99 %] 98 % 2022/12/30 0835) Last BM Date: 12/02/12 240 PO 1000 ml from NG Ice chips/TNA Afebrile, VSS BMP is OK Film looks better, air in colon, SB distension better. Intake/Output from previous day: 11/04 0701 - 2022-12-30 0700 In: 2495.9 [P.O.:240; I.V.:1641.3; TPN:614.7] Out: 3050 [Urine:2050; Emesis/NG output:1000] Intake/Output this shift: Total I/O In: -  Out: 200 [Urine:200]  General appearance: alert, cooperative and no distress Resp: clear to auscultation bilaterally GI: soft, not distended, few BS, still hypoactive.  wound ok  Lab Results:   Recent Labs  12/12/12 0440  WBC 4.6  HGB 12.6*  HCT 35.0*  PLT 200    BMET  Recent Labs  12/12/12 0440 12/29/2012 0530  NA 136 135  K 3.9 3.9  CL 102 100  CO2 26 27  GLUCOSE 102* 114*  BUN 10 12  CREATININE 1.13 1.19  CALCIUM 9.2 9.8   PT/INR No results found for this basename: LABPROT, INR,  in the last 72 hours   Recent Labs Lab 12/12/12 0440  AST 18  ALT 11  ALKPHOS 57  BILITOT 0.5  PROT 6.9  ALBUMIN 3.2*     Lipase     Component Value Date/Time   LIPASE 17 12/05/2012 1755     Studies/Results: Dg Abd 2 Views  12-29-2012   CLINICAL DATA:  post op ileus  EXAM: ABDOMEN - 2 VIEW  COMPARISON:  DG ABD PORTABLE 2V dated 12/07/2012  FINDINGS: NG tube within the stomach. There is interval decrease in dilated loops of small bowel. Midline surgical staples within the scanner noted. No evidence of free air or obstruction.  IMPRESSION: 1. Interval relief of small bowel obstruction following surgery.  2.  NG tube in stomach.   Electronically Signed   By: Genevive Bi M.D.   On: December 29, 2012 08:14    Medications: . antiseptic oral rinse  15 mL Mouth Rinse BID  . chlorhexidine  15 mL Mouth Rinse BID  . enoxaparin (LOVENOX) injection  40 mg Subcutaneous Q24H  . HYDROmorphone PCA 0.3 mg/mL   Intravenous Q4H  . insulin aspart  0-9 Units Subcutaneous Q4H  . lidocaine  15 mL Mouth/Throat Once  . lip balm  1 application Topical BID  . pantoprazole (PROTONIX) IV  40 mg Intravenous Q24H  . sodium chloride  1,000 mL Intravenous Once    Assessment/Plan Bowel obstruction.  Exploratory laparotomy with lysis of adhesions and reduction of internal hernia. 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 SBO medical management 2013  Post op ileus  Hx of Asthma  Lovenox for DVT  Malnutrition with TNA now   PlaN:  NG clamping trials, stop PCA, try some liquid pain meds, continue to mobilize.  LOS: 8 days    Gregory Castro December 29, 2012

## 2012-12-13 NOTE — Progress Notes (Signed)
PARENTERAL NUTRITION CONSULT NOTE   Pharmacy Consult for TPN Indication: Bowel obstruction, prolonged post-operative ileus  Allergies  Allergen Reactions  . Shellfish Allergy Anaphylaxis and Swelling  . Bentyl [Dicyclomine] Nausea And Vomiting    Patient Measurements: Height: 5\' 4"  (162.6 cm) Weight: 150 lb (68.04 kg) IBW/kg (Calculated) : 59.2 Adjusted Body Weight: 62 kg   Vital Signs: Temp: 98.1 F (36.7 C) (11/05 0503) Temp src: Oral (11/05 0503) BP: 119/88 mmHg (11/05 0503) Pulse Rate: 78 (11/05 0503) Intake/Output from previous day: 11/04 0701 - 11/05 0700 In: 2495.9 [P.O.:240; I.V.:1641.3; TPN:614.7] Out: 3050 [Urine:2050; Emesis/NG output:1000] Intake/Output from this shift: Total I/O In: -  Out: 200 [Urine:200]  Labs:  Recent Labs  12/12/12 0440  WBC 4.6  HGB 12.6*  HCT 35.0*  PLT 200     Recent Labs  12/11/12 0400 12/12/12 0440 12/13/12 0530  NA 133* 136 135  K 4.2 3.9 3.9  CL 100 102 100  CO2 26 26 27   GLUCOSE 115* 102* 114*  BUN 8 10 12   CREATININE 1.17 1.13 1.19  CALCIUM 9.1 9.2 9.8  MG 1.7 1.9 2.2  PHOS  --  3.7 3.8  PROT  --  6.9  --   ALBUMIN  --  3.2*  --   AST  --  18  --   ALT  --  11  --   ALKPHOS  --  57  --   BILITOT  --  0.5  --   PREALBUMIN  --  24.3  --   TRIG  --  84  --    Estimated Creatinine Clearance: 67.7 ml/min (by C-G formula based on Cr of 1.19).    Recent Labs  12/12/12 2005 12/12/12 2346 12/13/12 0438  GLUCAP 90 122* 124*    Medical History: Past Medical History  Diagnosis Date  . Bronchitis   . Asthma   . SBO (small bowel obstruction)   . Gastroenteritis   . Ileus, postoperative     Medications:  Scheduled:  . antiseptic oral rinse  15 mL Mouth Rinse BID  . chlorhexidine  15 mL Mouth Rinse BID  . enoxaparin (LOVENOX) injection  40 mg Subcutaneous Q24H  . HYDROmorphone PCA 0.3 mg/mL   Intravenous Q4H  . insulin aspart  0-9 Units Subcutaneous Q4H  . lidocaine  15 mL Mouth/Throat Once  .  lip balm  1 application Topical BID  . pantoprazole (PROTONIX) IV  40 mg Intravenous Q24H  . sodium chloride  1,000 mL Intravenous Once   Infusions:  . Marland KitchenTPN (CLINIMIX-E) Adult 60 mL/hr at 12/12/12 1716   And  . fat emulsion 250 mL (12/12/12 1716)  . sodium chloride 0.45 % with kcl 55 mL/hr at 12/13/12 1610   PRN: acetaminophen, alum & mag hydroxide-simeth, diphenhydrAMINE, diphenhydrAMINE, HYDROmorphone (DILAUDID) injection, LORazepam, magic mouthwash, menthol-cetylpyridinium, metoprolol, naloxone, ondansetron, ondansetron (ZOFRAN) IV, phenol, sodium chloride, sodium chloride  Insulin Requirements in the past 24 hours:  None  Current Nutrition:  NPO except ice chips  Maintenance IVF: 1/2 NS + KCl 70mEq/L at 55 mL/hr  Assessment: 42 y/o M admitted with SBO, underwent exploratory laparotomy with lysis of adhesions and reduction of internal hernia on 12/07/2012, now with prolonged postoperative ileus.  For placement of PICC and initiation of TPN today with pharmacy and dietary to assist with management.  11/5: D3 TPN    Glucose - CBGs at goal < 150mg /dl, 2 units novolog RUE/45W  Renal: SCr = 1.19 with good UOP  Electrolytes -  Corr Ca at ULN (= 10.44) - monitor  LFTs - WNL  TGs - 84 (11/4)  Prealbumin - 24.3 (11/4) - WNL  TPN Access: PICC (placed 11/3)  Nutritional Goals:  As per RD assessment 12/11/2012:  1650 - 1850 kCal/d,  80-100 grams protein per day 1.6 - 1.8 L fluid per day  Goal Formula and Rate: Clinimix-E 5/15 at 75 mL/hr + Lipids 20% at 10 mL/hr to provide 1758 KCal/day, 90 grams protein/day  Plan:  At 1800 tonight: 1.  Tolerating TPN, advance Clinimix-E 5/15 to goal of 20ml/hr   2.  Continue Lipids 20% at 10 mL/hr. 3.  Include standard multivitamins and trace elements in each bag TPN. 4.  Reduce MIVF 1/2NS + KCl to 40 mL/hr at 18:00 for TPN advancement 5.  Continue CBGs q4h, cover with Novolog "sensitive" sliding scale. 6.  BMP, Mg and Phos 7.   Routine TPN labs every Monday and Thursday.  Juliette Alcide, PharmD, BCPS.   Pager: [phone] 12/13/2012  10:43 AM

## 2012-12-14 LAB — COMPREHENSIVE METABOLIC PANEL
ALT: 31 U/L (ref 0–53)
AST: 29 U/L (ref 0–37)
Albumin: 3.3 g/dL — ABNORMAL LOW (ref 3.5–5.2)
Alkaline Phosphatase: 71 U/L (ref 39–117)
CO2: 26 mEq/L (ref 19–32)
Calcium: 9.3 mg/dL (ref 8.4–10.5)
Chloride: 101 mEq/L (ref 96–112)
GFR calc Af Amer: >90 mL/min (ref >90)
GFR calc non Af Amer: 84 mL/min — ABNORMAL LOW (ref >90)
Glucose, Bld: 109 mg/dL — ABNORMAL HIGH (ref 70–99)
Potassium: 3.8 mEq/L (ref 3.5–5.1)
Sodium: 136 mEq/L (ref 135–145)
Total Bilirubin: 0.7 mg/dL (ref 0.3–1.2)
Total Protein: 7.2 g/dL (ref 6.0–8.3)

## 2012-12-14 LAB — GLUCOSE, CAPILLARY
Glucose-Capillary: 122 mg/dL — ABNORMAL HIGH (ref 70–99)
Glucose-Capillary: 111 mg/dL — ABNORMAL HIGH (ref 70–99)
Glucose-Capillary: 104 mg/dL — ABNORMAL HIGH (ref 70–99)

## 2012-12-14 MED ORDER — CLINIMIX E/DEXTROSE (5/15) 5 % IV SOLN
INTRAVENOUS | Status: AC
  Administered 2012-12-14: 17:00:00 via INTRAVENOUS
  Filled 2012-12-14: qty 2000

## 2012-12-14 MED ORDER — INSULIN ASPART 100 UNIT/ML ~~LOC~~ SOLN
0.0000 [IU] | Freq: Four times a day (QID) | SUBCUTANEOUS | Status: DC
  Administered 2012-12-15 – 2012-12-16 (×2): 1 [IU] via SUBCUTANEOUS

## 2012-12-14 MED ORDER — FAT EMULSION 20 % IV EMUL
250.0000 mL | INTRAVENOUS | Status: AC
  Administered 2012-12-14: 250 mL via INTRAVENOUS
  Filled 2012-12-14: qty 250

## 2012-12-14 NOTE — Progress Notes (Signed)
In good spirits. Nasogastric tube now out.  Starting clears.  Abdomen less distended and soft.  Hopefully will improve and can be discharged in a few days:  D/C patient from hospital when patient meets criteria (anticipate in 2-3 day(s)):  Tolerating oral intake well Ambulating in walkways Adequate pain control without IV medications Urinating  Having flatus

## 2012-12-14 NOTE — Progress Notes (Signed)
7 Days Post-Op  Subjective: Feels better, no nausea with NG clamped.  Flatus, no Bm.  Objective: Vital signs in last 24 hours: Temp:  [98 F (36.7 C)-98.9 F (37.2 C)] 98 F (36.7 C) (11/06 0602) Pulse Rate:  [72-78] 72 (11/06 0602) Resp:  [16-18] 16 (11/06 0602) BP: (123-141)/(81-99) 124/82 mmHg (11/06 0602) SpO2:  [96 %-99 %] 96 % (11/06 0602) Last BM Date: 12/02/12 200 from the NG NPO Afebrile, VSS Labs OK this AM Intake/Output from previous day: 16-Dec-2022 0701 - 11/06 0700 In: 3747.4 [P.O.:240; I.V.:1571.3; TPN:1836.1] Out: 2550 [Urine:2350; Emesis/NG output:200] Intake/Output this shift:    General appearance: alert, cooperative and no distress Resp: clear to auscultation bilaterally GI: soft, tender, not distended, incision looks fine.  Lab Results:   Recent Labs  12/12/12 0440  WBC 4.6  HGB 12.6*  HCT 35.0*  PLT 200    BMET  Recent Labs  12/15/12 0530 12/14/12 0555  NA 135 136  K 3.9 3.8  CL 100 101  CO2 27 26  GLUCOSE 114* 109*  BUN 12 13  CREATININE 1.19 1.07  CALCIUM 9.8 9.3   PT/INR No results found for this basename: LABPROT, INR,  in the last 72 hours   Recent Labs Lab 12/12/12 0440 12/14/12 0555  AST 18 29  ALT 11 31  ALKPHOS 57 71  BILITOT 0.5 0.7  PROT 6.9 7.2  ALBUMIN 3.2* 3.3*     Lipase     Component Value Date/Time   LIPASE 17 12/05/2012 1755     Studies/Results: Dg Abd 2 Views  2012-12-15   CLINICAL DATA:  post op ileus  EXAM: ABDOMEN - 2 VIEW  COMPARISON:  DG ABD PORTABLE 2V dated 12/07/2012  FINDINGS: NG tube within the stomach. There is interval decrease in dilated loops of small bowel. Midline surgical staples within the scanner noted. No evidence of free air or obstruction.  IMPRESSION: 1. Interval relief of small bowel obstruction following surgery.  2.  NG tube in stomach.   Electronically Signed   By: Genevive Bi M.D.   On: 12-15-2012 08:14    Medications: . antiseptic oral rinse  15 mL Mouth Rinse BID   . chlorhexidine  15 mL Mouth Rinse BID  . enoxaparin (LOVENOX) injection  40 mg Subcutaneous Q24H  . insulin aspart  0-9 Units Subcutaneous Q4H  . lidocaine  15 mL Mouth/Throat Once  . lip balm  1 application Topical BID  . pantoprazole (PROTONIX) IV  40 mg Intravenous Q24H  . sodium chloride  1,000 mL Intravenous Once    Assessment/Plan Bowel obstruction.  Exploratory laparotomy with lysis of adhesions and reduction of internal hernia. 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 SBO medical management 2013  Post op ileus  Hx of Asthma  Lovenox for DVT  Malnutrition with TNA now   PLan:  Start some clears and if he does well d/c NG tube later today.  Continue to mobilize.   LOS: 9 days    Gregory Castro 12/14/2012

## 2012-12-14 NOTE — Progress Notes (Addendum)
PARENTERAL NUTRITION CONSULT NOTE   Pharmacy Consult for TPN Indication: Bowel obstruction, prolonged post-operative ileus   Allergies  Allergen Reactions  . Shellfish Allergy Anaphylaxis and Swelling  . Bentyl [Dicyclomine] Nausea And Vomiting    Patient Measurements: Height: 5\' 4"  (162.6 cm) Weight: 150 lb (68.04 kg) IBW/kg (Calculated) : 59.2 Adjusted Body Weight: 62 kg  Vital Signs: Temp: 98 F (36.7 C) (11/06 0602) Temp src: Oral (11/06 0602) BP: 124/82 mmHg (11/06 0602) Pulse Rate: 72 (11/06 0602) Intake/Output from previous day: 11/05 0701 - 11/06 0700 In: 3747.4 [P.O.:240; I.V.:1571.3; TPN:1836.1] Out: 2550 [Urine:2350; Emesis/NG output:200]  Labs:  Recent Labs  12/12/12 0440  WBC 4.6  HGB 12.6*  HCT 35.0*  PLT 200     Recent Labs  12/12/12 0440 12/13/12 0530 12/14/12 0555  NA 136 135 136  K 3.9 3.9 3.8  CL 102 100 101  CO2 26 27 26   GLUCOSE 102* 114* 109*  BUN 10 12 13   CREATININE 1.13 1.19 1.07  CALCIUM 9.2 9.8 9.3  MG 1.9 2.2 2.1  PHOS 3.7 3.8 4.0  PROT 6.9  --  7.2  ALBUMIN 3.2*  --  3.3*  AST 18  --  29  ALT 11  --  31  ALKPHOS 57  --  71  BILITOT 0.5  --  0.7  PREALBUMIN 24.3  --   --   TRIG 84  --   --    Estimated Creatinine Clearance: 75.3 ml/min (by C-G formula based on Cr of 1.07).    Recent Labs  12/13/12 2317 12/14/12 0327 12/14/12 0759  GLUCAP 110* 122* 111*    Medications:  Infusions:  . Marland KitchenTPN (CLINIMIX-E) Adult 75 mL/hr at 12/13/12 1822   And  . fat emulsion 250 mL (12/13/12 1822)  . sodium chloride 0.45 % with kcl 40 mL/hr at 12/13/12 1800   Nutritional Goals:   RD assessment 11/3:  1650 - 1850 kCal/d, 80-100 grams protein per day, 1.6 - 1.8 L fluid per day  Goal Formula and Rate: Clinimix-E 5/15 at 75 mL/hr + Lipids 20% at 10 mL/hr to provide 1758 KCal/day, 90 grams protein/day  Insulin Requirements in the past 24 hours:  Sensitive SSI:  1 unit given  Current Nutrition:   NPO except ice  chips  Maintenance IVF: 1/2 NS + KCl 43mEq/L at 40 mL/hr  TPN at goal rate 75 ml/hr and Lipids at 10 ml/hr  Assessment: 42 y/o M admitted with SBO, underwent exploratory laparotomy with lysis of adhesions and reduction of internal hernia on 12/07/2012, now with prolonged postoperative ileus.  Placement of PICC 11/3 and initiation of TPN today with pharmacy and dietary to assist with management.   Glucose - CBGs at goal < 150mg /dl  Renal: SCr = 1.61 with good UOP (1.4 ml/kg/hr)  Electrolytes - Elytes wnl, Corr Ca 9.8   LFTs - WNL  TGs - 84 (11/4)  Prealbumin - 24.3 (11/4) - WNL  TPN Access: PICC (placed 11/3) 11/6: D4 TPN.  Clamping NGT today.  Plan:   Continue Clinimix-E 5/15 at goal rate 59ml/hr    Continue Lipids 20% at 10 mL/hr.  TNA to contain standard multivitamins and trace elements.  Continue mIVF at 40 ml/hr  Decrease sensitive SSI and CBGs to q6h.  TNA lab panels on Mondays & Thursdays.  F/u daily.   Lynann Beaver PharmD, BCPS Pager [phone] 12/14/2012 10:18 AM

## 2012-12-15 LAB — GLUCOSE, CAPILLARY: Glucose-Capillary: 119 mg/dL — ABNORMAL HIGH (ref 70–99)

## 2012-12-15 MED ORDER — ENSURE COMPLETE PO LIQD
237.0000 mL | Freq: Three times a day (TID) | ORAL | Status: DC
  Administered 2012-12-15 – 2012-12-18 (×11): 237 mL via ORAL

## 2012-12-15 MED ORDER — FAT EMULSION 20 % IV EMUL
250.0000 mL | INTRAVENOUS | Status: AC
  Administered 2012-12-15: 250 mL via INTRAVENOUS
  Filled 2012-12-15: qty 250

## 2012-12-15 MED ORDER — ADULT MULTIVITAMIN W/MINERALS CH
1.0000 | ORAL_TABLET | Freq: Every day | ORAL | Status: DC
  Administered 2012-12-15 – 2012-12-19 (×5): 1 via ORAL
  Filled 2012-12-15 (×5): qty 1

## 2012-12-15 MED ORDER — CLINIMIX E/DEXTROSE (5/15) 5 % IV SOLN
INTRAVENOUS | Status: AC
  Administered 2012-12-15: 17:00:00 via INTRAVENOUS
  Filled 2012-12-15: qty 2000

## 2012-12-15 NOTE — Progress Notes (Signed)
8 Days Post-Op  Subjective: Feels better, having flatus.  No BM.  Tolerated clears  Objective: Vital signs in last 24 hours: Temp:  [97.2 F (36.2 C)-98.4 F (36.9 C)] 97.2 F (36.2 C) (11/07 0536) Pulse Rate:  [64-70] 70 (11/07 0536) Resp:  [16-18] 16 (11/07 0536) BP: (121-132)/(82-89) 121/82 mmHg (11/07 0536) SpO2:  [96 %-98 %] 96 % (11/07 0536) Last BM Date: 12/02/12  Afebrile, VSS Labs OK this AM Intake/Output from previous day: 11/06 0701 - 11/07 0700 In: 2134.6 [P.O.:480; I.V.:648.7; TPN:1005.9] Out: 1275 [Urine:1275] Intake/Output this shift: Total I/O In: -  Out: 400 [Urine:400]  General appearance: alert, cooperative and no distress Resp: clear to auscultation bilaterally GI: soft, tender, not distended, incision looks fine.  Lab Results:  No results found for this basename: WBC, HGB, HCT, PLT,  in the last 72 hours  BMET  Recent Labs  12/13/12 0530 12/14/12 0555  NA 135 136  K 3.9 3.8  CL 100 101  CO2 27 26  GLUCOSE 114* 109*  BUN 12 13  CREATININE 1.19 1.07  CALCIUM 9.8 9.3   PT/INR No results found for this basename: LABPROT, INR,  in the last 72 hours   Recent Labs Lab 12/12/12 0440 12/14/12 0555  AST 18 29  ALT 11 31  ALKPHOS 57 71  BILITOT 0.5 0.7  PROT 6.9 7.2  ALBUMIN 3.2* 3.3*     Lipase     Component Value Date/Time   LIPASE 17 12/05/2012 1755     Studies/Results: No results found.  Medications: . antiseptic oral rinse  15 mL Mouth Rinse BID  . chlorhexidine  15 mL Mouth Rinse BID  . enoxaparin (LOVENOX) injection  40 mg Subcutaneous Q24H  . insulin aspart  0-9 Units Subcutaneous Q6H  . lidocaine  15 mL Mouth/Throat Once  . lip balm  1 application Topical BID  . multivitamin with minerals  1 tablet Oral Daily  . pantoprazole (PROTONIX) IV  40 mg Intravenous Q24H  . sodium chloride  1,000 mL Intravenous Once    Assessment/Plan Bowel obstruction.  Exploratory laparotomy with lysis of adhesions and reduction of  internal hernia. 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 SBO medical management 2013  Post op ileus  Hx of Asthma  Lovenox for DVT  Malnutrition with TNA now   PLan:    Continue to mobilize.  Advance to full liquids.  Wean TPN  LOS: 10 days    Con Arganbright C. 12/15/2012

## 2012-12-15 NOTE — Progress Notes (Signed)
In better spirits.  Agree with advancing diet.  Continue walking well knowledge.  Begin bowel fiber regimen soon to prevent further problems in the future

## 2012-12-15 NOTE — Progress Notes (Addendum)
PARENTERAL NUTRITION CONSULT NOTE   Pharmacy Consult for TPN Indication: Bowel obstruction, prolonged post-operative ileus   Allergies  Allergen Reactions  . Shellfish Allergy Anaphylaxis and Swelling  . Bentyl [Dicyclomine] Nausea And Vomiting    Patient Measurements: Height: 5\' 4"  (162.6 cm) Weight: 150 lb (68.04 kg) IBW/kg (Calculated) : 59.2 Adjusted Body Weight: 62 kg  Vital Signs: Temp: 97.2 F (36.2 C) (11/07 0536) Temp src: Oral (11/07 0536) BP: 121/82 mmHg (11/07 0536) Pulse Rate: 70 (11/07 0536) Intake/Output from previous day: 11/06 0701 - 11/07 0700 In: 2134.6 [P.O.:480; I.V.:648.7; TPN:1005.9] Out: 1275 [Urine:1275]  Labs: No results found for this basename: WBC, HGB, HCT, PLT, APTT, INR,  in the last 72 hours   Recent Labs  12/13/12 0530 12/14/12 0555  NA 135 136  K 3.9 3.8  CL 100 101  CO2 27 26  GLUCOSE 114* 109*  BUN 12 13  CREATININE 1.19 1.07  CALCIUM 9.8 9.3  MG 2.2 2.1  PHOS 3.8 4.0  PROT  --  7.2  ALBUMIN  --  3.3*  AST  --  29  ALT  --  31  ALKPHOS  --  71  BILITOT  --  0.7   Estimated Creatinine Clearance: 75.3 ml/min (by C-G formula based on Cr of 1.07).    Recent Labs  12/14/12 2153 12/15/12 0047 12/15/12 0521  GLUCAP 99 128* 119*   Insulin Requirements in the past 24 hours:  Sensitive SSI:  0 units  Nutritional Goals:   RD assessment 11/3:  1650 - 1850 kCal/d, 80-100 grams protein per day, 1.6 - 1.8 L fluid per day  Goal Formula and Rate: Clinimix-E 5/15 at 75 mL/hr + Lipids 20% at 10 mL/hr to provide 1758 KCal/day, 90 grams protein/day  Current Nutrition:   Diet: advanced to Full liquids on 11/7  Maintenance IVF: 1/2 NS + KCl 34mEq/L at 40 mL/hr  TPN at goal rate 75 ml/hr and Lipids at 10 ml/hr  Assessment: 42 y/o M admitted with SBO, underwent exploratory laparotomy with lysis of adhesions and reduction of internal hernia on 12/07/2012, now with prolonged postoperative ileus.  Placement of PICC 11/3 and  initiation of TPN with pharmacy and dietary to assist with management.   Glucose - CBGs at goal < 150mg /dl  Renal: (16/1) SCr = 0.96 with good UOP (1.4 ml/kg/hr)  Electrolytes - (11/6) Elytes wnl, Corr Ca 9.8   LFTs - WNL  TGs - 84 (11/4)  Prealbumin - 24.3 (11/4) - WNL  TPN Access: PICC (placed 11/3) 11/7: D5 TPN.  No nausea after clamping NGT.  Flatus, but no BM yet.  Planning to advance diet further tomorrow AM and wean the TPN if he continues to tolerate.  Plan:   Continue Clinimix-E 5/15 at goal rate 79ml/hr    Continue Lipids 20% at 10 mL/hr.  Change to MVI tablet PO daily.  Continue mIVF at 40 ml/hr  Decrease sensitive SSI and CBGs to q6h.  TNA lab panels on Mondays & Thursdays.  F/u daily.   Lynann Beaver PharmD, BCPS Pager [phone] 12/15/2012 10:10 AM

## 2012-12-15 NOTE — Progress Notes (Signed)
8 Days Post-Op  Subjective: Doing better animated conversation on phone.  Doing well with clears, + flatus, no Bm  Objective: Vital signs in last 24 hours: Temp:  [97.2 F (36.2 C)-98.4 F (36.9 C)] 97.2 F (36.2 C) (11/07 0536) Pulse Rate:  [64-74] 70 (11/07 0536) Resp:  [16-18] 16 (11/07 0536) BP: (121-132)/(79-89) 121/82 mmHg (11/07 0536) SpO2:  [96 %-100 %] 96 % (11/07 0536) Last BM Date: 12/02/12 PO 480 recorded. Afebrile, VSS Lab OK yesterday Intake/Output from previous day: 11/06 0701 - 11/07 0700 In: 2134.6 [P.O.:480; I.V.:648.7; TPN:1005.9] Out: 1275 [Urine:1275] Intake/Output this shift: Total I/O In: -  Out: 400 [Urine:400]  General appearance: alert, cooperative and no distress GI: few BS, wounds and abd look fine.  Lab Results:  No results found for this basename: WBC, HGB, HCT, PLT,  in the last 72 hours  BMET  Recent Labs  12/13/12 0530 12/14/12 0555  NA 135 136  K 3.9 3.8  CL 100 101  CO2 27 26  GLUCOSE 114* 109*  BUN 12 13  CREATININE 1.19 1.07  CALCIUM 9.8 9.3   PT/INR No results found for this basename: LABPROT, INR,  in the last 72 hours   Recent Labs Lab 12/12/12 0440 12/14/12 0555  AST 18 29  ALT 11 31  ALKPHOS 57 71  BILITOT 0.5 0.7  PROT 6.9 7.2  ALBUMIN 3.2* 3.3*     Lipase     Component Value Date/Time   LIPASE 17 12/05/2012 1755     Studies/Results: No results found.  Medications: . antiseptic oral rinse  15 mL Mouth Rinse BID  . chlorhexidine  15 mL Mouth Rinse BID  . enoxaparin (LOVENOX) injection  40 mg Subcutaneous Q24H  . insulin aspart  0-9 Units Subcutaneous Q6H  . lidocaine  15 mL Mouth/Throat Once  . lip balm  1 application Topical BID  . pantoprazole (PROTONIX) IV  40 mg Intravenous Q24H  . sodium chloride  1,000 mL Intravenous Once    Assessment/Plan Bowel obstruction.  Exploratory laparotomy with lysis of adhesions and reduction of internal hernia. 12/08/2012, [name], DO  S/p bowel  resection 2012 [place] and as a young child. SBO medical management 2013  Post op ileus  Hx of Asthma  Lovenox for DVT  Malnutrition with TNA now   Plan:  Full liquids, ask pharmacy to start weaning TNA in AM.  Advance to as he tolerates diet.  He is up walking.  LOS: 10 days    Gregory Castro 12/15/2012

## 2012-12-15 NOTE — Progress Notes (Signed)
NUTRITION FOLLOW UP  Intervention:   - TPN per pharmacy - Ensure Complete TID - Diet advancement per MD - Will continue to monitor   Nutrition Dx:   Inadequate oral intake related to inability to eat as evidenced by NPO - no longer appropriate, diet advanced.  New nutrition dx: Increased nutrient needs related to recent surgery (lysis of adhesions and reduction of internal hernia 10/31) as evidenced by MD notes.   Goal:   TPN to meet >90% of estimated nutritional needs - met  New goal: Pt to consume >90% of meals/supplements.  Monitor:   Weights, labs, intake, TPN, diet advancement, BM  Assessment:   Admitted with abdominal pain with nausea, last BM 3 days PTA. Found to have small bowel obstruction. Per surgeon's notes, pt with chronic abdominal pain and intermittent constipation with hx of numerous abdominal surgeries. NGT placed 10/29. Had exploratory laparotomy with lysis of adhesions and reduction of internal hernia 10/30. Pt with post-op ileus, started TPN 11/3.   NGT d/c yesterday. Full liquid diet started this morning. Met with pt who had not yet ordered meal but reports tolerating clear liquid diet well yesterday without any nausea. Nutritional supplements discussed, pt interested in getting Ensure Complete BID. Plan is to start weaning TPN tomorrow morning.  No new weights. Abdominal x-ray 11/5 showed no evidence of free air or obstruction. Having flatus but no BM.   CBGs < 150 mg/dL  TPN: Clinimix-E 7/82 at 75 mL/hr + Lipids 20% at 10 mL/hr to provide 1758 KCal/day, 90 grams protein/day meeting 106% estimated calorie needs, 112% estimated protein needs   Height: Ht Readings from Last 1 Encounters:  12/05/12 5\' 4"  (1.626 m)    Weight Status:   Wt Readings from Last 1 Encounters:  12/05/12 150 lb (68.04 kg)    Re-estimated needs:  Kcal: 1650-1850  Protein: 80-100g  Fluid: 1.6-1.8L/day   Skin: Abdominal incision    Diet Order: Full Liquid   Intake/Output  Summary (Last 24 hours) at 12/15/12 1011 Last data filed at 12/15/12 0753  Gross per 24 hour  Intake 1607.5 ml  Output   1675 ml  Net  -67.5 ml    Last BM: 10/25   Labs:   Recent Labs Lab 12/12/12 0440 12/13/12 0530 12/14/12 0555  NA 136 135 136  K 3.9 3.9 3.8  CL 102 100 101  CO2 26 27 26   BUN 10 12 13   CREATININE 1.13 1.19 1.07  CALCIUM 9.2 9.8 9.3  MG 1.9 2.2 2.1  PHOS 3.7 3.8 4.0  GLUCOSE 102* 114* 109*    CBG (last 3)   Recent Labs  12/14/12 2153 12/15/12 0047 12/15/12 0521  GLUCAP 99 128* 119*    Scheduled Meds: . antiseptic oral rinse  15 mL Mouth Rinse BID  . chlorhexidine  15 mL Mouth Rinse BID  . enoxaparin (LOVENOX) injection  40 mg Subcutaneous Q24H  . insulin aspart  0-9 Units Subcutaneous Q6H  . lidocaine  15 mL Mouth/Throat Once  . lip balm  1 application Topical BID  . pantoprazole (PROTONIX) IV  40 mg Intravenous Q24H  . sodium chloride  1,000 mL Intravenous Once    Continuous Infusions: . Marland KitchenTPN (CLINIMIX-E) Adult 75 mL/hr at 12/14/12 1720   And  . fat emulsion 250 mL (12/14/12 1719)  . sodium chloride 0.45 % with kcl 40 mL/hr at 12/15/12 0915    Levon Hedger MS, RD, LDN [phone] Pager [phone] After Hours Pager

## 2012-12-16 LAB — GLUCOSE, CAPILLARY
Glucose-Capillary: 97 mg/dL (ref 70–99)
Glucose-Capillary: 93 mg/dL (ref 70–99)
Glucose-Capillary: 123 mg/dL — ABNORMAL HIGH (ref 70–99)
Glucose-Capillary: 102 mg/dL — ABNORMAL HIGH (ref 70–99)

## 2012-12-16 MED ORDER — BISACODYL 10 MG RE SUPP
10.0000 mg | Freq: Two times a day (BID) | RECTAL | Status: DC | PRN
  Administered 2012-12-18: 10 mg via RECTAL
  Filled 2012-12-16: qty 1

## 2012-12-16 MED ORDER — CLINIMIX E/DEXTROSE (5/15) 5 % IV SOLN
INTRAVENOUS | Status: DC
  Administered 2012-12-16: 18:00:00 via INTRAVENOUS
  Filled 2012-12-16: qty 1000

## 2012-12-16 MED ORDER — FAT EMULSION 20 % IV EMUL
250.0000 mL | INTRAVENOUS | Status: DC
  Administered 2012-12-16: 250 mL via INTRAVENOUS
  Filled 2012-12-16: qty 250

## 2012-12-16 MED ORDER — OXYCODONE HCL 5 MG/5ML PO SOLN
5.0000 mg | ORAL | Status: DC | PRN
  Administered 2012-12-17 – 2012-12-18 (×3): 10 mg via ORAL
  Filled 2012-12-16 (×3): qty 10

## 2012-12-16 MED ORDER — HYDROMORPHONE HCL PF 1 MG/ML IJ SOLN
0.5000 mg | INTRAMUSCULAR | Status: DC | PRN
  Administered 2012-12-16 – 2012-12-18 (×7): 1 mg via INTRAVENOUS
  Administered 2012-12-18: 2 mg via INTRAVENOUS
  Administered 2012-12-19 (×2): 1 mg via INTRAVENOUS
  Filled 2012-12-16: qty 2
  Filled 2012-12-16 (×7): qty 1
  Filled 2012-12-16: qty 2
  Filled 2012-12-16: qty 1

## 2012-12-16 MED ORDER — SACCHAROMYCES BOULARDII 250 MG PO CAPS
250.0000 mg | ORAL_CAPSULE | Freq: Two times a day (BID) | ORAL | Status: DC
  Administered 2012-12-16 – 2012-12-19 (×6): 250 mg via ORAL
  Filled 2012-12-16 (×7): qty 1

## 2012-12-16 MED ORDER — ACETAMINOPHEN 500 MG PO TABS
1000.0000 mg | ORAL_TABLET | Freq: Three times a day (TID) | ORAL | Status: DC
  Administered 2012-12-16 – 2012-12-19 (×8): 1000 mg via ORAL
  Filled 2012-12-16 (×10): qty 2

## 2012-12-16 NOTE — Progress Notes (Addendum)
PARENTERAL NUTRITION CONSULT NOTE   Pharmacy Consult for TPN Indication: Bowel obstruction, prolonged post-operative ileus   Allergies  Allergen Reactions  . Shellfish Allergy Anaphylaxis and Swelling  . Bentyl [Dicyclomine] Nausea And Vomiting    Patient Measurements: Height: 5\' 4"  (162.6 cm) Weight: 150 lb (68.04 kg) IBW/kg (Calculated) : 59.2 Adjusted Body Weight: 62 kg  Vital Signs: Temp: 98.1 F (36.7 C) (11/08 0601) Temp src: Oral (11/08 0601) BP: 115/75 mmHg (11/08 0601) Pulse Rate: 71 (11/08 0601) Intake/Output from previous day: 11/07 0701 - 11/08 0700 In: 1640.7 [P.O.:360; I.V.:1280.7] Out: 1554 [Urine:1554]  Labs: No results found for this basename: WBC, HGB, HCT, PLT, APTT, INR,  in the last 72 hours   Recent Labs  12/14/12 0555  NA 136  K 3.8  CL 101  CO2 26  GLUCOSE 109*  BUN 13  CREATININE 1.07  CALCIUM 9.3  MG 2.1  PHOS 4.0  PROT 7.2  ALBUMIN 3.3*  AST 29  ALT 31  ALKPHOS 71  BILITOT 0.7   Estimated Creatinine Clearance: 75.3 ml/min (by C-G formula based on Cr of 1.07).    Recent Labs  12/15/12 1142 12/15/12 1830 12/16/12 0018  GLUCAP 97 93 99   Insulin Requirements in the past 24 hours:  Sensitive SSI:  1 unit Novolog  Nutritional Goals:   RD assessment 11/3:  1650 - 1850 kCal/d, 80-100 grams protein per day, 1.6 - 1.8 L fluid per day  Goal Formula and Rate: Clinimix-E 5/15 at 75 mL/hr + Lipids 20% at 10 mL/hr to provide 1758 KCal/day, 90 grams protein/day  Current Nutrition:   Diet: advanced to Full liquids on 11/7  Maintenance IVF: 1/2 NS + KCl 50mEq/L at 40 mL/hr  TPN at goal rate 75 ml/hr and Lipids at 10 ml/hr  Assessment: 42 y/o M admitted with SBO, underwent exploratory laparotomy with lysis of adhesions and reduction of internal hernia on 12/07/2012, now with prolonged postoperative ileus.  Placement of PICC 11/3 and initiation of TPN with pharmacy and dietary to assist with management.   Glucose - CBGs at  goal < 150mg /dl  Renal: (16/1) SCr = 0.96 with good UOP (1.4 ml/kg/hr)  Electrolytes - (11/6) Elytes wnl, Corr Ca 9.8   LFTs - WNL  TGs - 84 (11/4)  Prealbumin - 24.3 (11/4) - WNL  TPN Access: PICC (placed 11/3) 11/8: D6 TPN.  No nausea after clamping NGT.  Flatus, but no BM yet.  Per patient tolerating full liquids and hopes for diet to be advanced  Plan:   Wean down Clinimix-E 5/15 to ~50% goals 11ml/hr    Continue Lipids 20% at 10 mL/hr.  Change to MVI tablet PO daily.  Continue mIVF at 40 ml/hr  Decrease sensitive SSI and CBGs to q6h.  TNA lab panels on Mondays & Thursdays.  F/u daily.  Wean TPN to off as tolerates diet  Juliette Alcide, PharmD, BCPS.   Pager: [phone] 12/16/2012 11:01 AM

## 2012-12-16 NOTE — Progress Notes (Signed)
Patient ID: Gregory Castro, male   DOB: 08-05-1970, 42 y.o.   MRN: [id number] [facility] Progress Note:   9 Days Post-Op  Subjective: Mental status is clear; Objective: Vital signs in last 24 hours: Temp:  [98 F (36.7 C)-98.1 F (36.7 C)] 98.1 F (36.7 C) (11/08 0601) Pulse Rate:  [71-83] 71 (11/08 0601) Resp:  [16-18] 18 (11/08 0601) BP: (108-115)/(70-77) 115/75 mmHg (11/08 0601) SpO2:  [95 %-99 %] 96 % (11/08 0601)  Intake/Output from previous day: 11/07 0701 - 11/08 0700 In: 1480 [P.O.:360; I.V.:1120] Out: 1554 [Urine:1554] Intake/Output this shift: Total I/O In: 1120 [I.V.:1120] Out: 850 [Urine:850]  Physical Exam: Work of breathing is normal.  Patient awakened.  No complaints.  TNA infusing.  On full liquid diet and passing flatus.   Lab Results:  Results for orders placed during the hospital encounter of 12/05/12 (from the past 48 hour(s))  GLUCOSE, CAPILLARY     Status: Abnormal   Collection Time    12/14/12  7:59 AM      Result Value Range   Glucose-Capillary 111 (*) 70 - 99 mg/dL  GLUCOSE, CAPILLARY     Status: Abnormal   Collection Time    12/14/12 11:59 AM      Result Value Range   Glucose-Capillary 113 (*) 70 - 99 mg/dL  GLUCOSE, CAPILLARY     Status: Abnormal   Collection Time    12/14/12  6:02 PM      Result Value Range   Glucose-Capillary 104 (*) 70 - 99 mg/dL  GLUCOSE, CAPILLARY     Status: None   Collection Time    12/14/12  9:53 PM      Result Value Range   Glucose-Capillary 99  70 - 99 mg/dL  GLUCOSE, CAPILLARY     Status: Abnormal   Collection Time    12/15/12 12:47 AM      Result Value Range   Glucose-Capillary 128 (*) 70 - 99 mg/dL   Comment 1 Documented in Chart     Comment 2 Notify RN    GLUCOSE, CAPILLARY     Status: Abnormal   Collection Time    12/15/12  5:21 AM      Result Value Range   Glucose-Capillary 119 (*) 70 - 99 mg/dL   Comment 1 Documented in Chart     Comment 2 Notify RN    GLUCOSE, CAPILLARY     Status:  None   Collection Time    12/15/12 11:42 AM      Result Value Range   Glucose-Capillary 97  70 - 99 mg/dL  GLUCOSE, CAPILLARY     Status: None   Collection Time    12/15/12  6:30 PM      Result Value Range   Glucose-Capillary 93  70 - 99 mg/dL  GLUCOSE, CAPILLARY     Status: None   Collection Time    12/16/12 12:18 AM      Result Value Range   Glucose-Capillary 99  70 - 99 mg/dL    Radiology/Results: No results found.  Anti-infectives: Anti-infectives   Start     Dose/Rate Route Frequency Ordered Stop   12/07/12 1030  [MAR Hold]  cefOXitin (MEFOXIN) 2 g in dextrose 5 % 50 mL IVPB     (On MAR Hold since 12/07/12 1126)   2 g 100 mL/hr over 30 Minutes Intravenous On call to O.R. 12/07/12 1027 12/07/12 1318      Assessment/Plan: Problem List: Patient Active Problem List  Diagnosis Date Noted  . Ileus, postoperative 12/11/2012  . SBO (small bowel obstruction) 12/05/2012  . Chronic abdominal pain 12/05/2012  . Constipation, chronic 12/05/2012    Resolving ileus postop.  Improved.  9 Days Post-Op    LOS: 11 days   Matt B. Daphine Deutscher, MD, [facility] [phone] beeper [phone]  12/16/2012 6:10 AM

## 2012-12-17 LAB — GLUCOSE, CAPILLARY: Glucose-Capillary: 94 mg/dL (ref 70–99)

## 2012-12-17 MED ORDER — FAT EMULSION 20 % IV EMUL
250.0000 mL | INTRAVENOUS | Status: AC
  Filled 2012-12-17: qty 250

## 2012-12-17 MED ORDER — CLINIMIX E/DEXTROSE (5/15) 5 % IV SOLN
INTRAVENOUS | Status: AC
  Filled 2012-12-17: qty 1000

## 2012-12-17 NOTE — Progress Notes (Signed)
PARENTERAL NUTRITION CONSULT NOTE   Pharmacy Consult for TPN Indication: Bowel obstruction, prolonged post-operative ileus   Allergies  Allergen Reactions  . Shellfish Allergy Anaphylaxis and Swelling  . Bentyl [Dicyclomine] Nausea And Vomiting    Patient Measurements: Height: 5\' 4"  (162.6 cm) Weight: 150 lb (68.04 kg) IBW/kg (Calculated) : 59.2 Adjusted Body Weight: 62 kg  Vital Signs: Temp: 97.8 F (36.6 C) (11/09 0615) Temp src: Oral (11/09 0615) BP: 96/59 mmHg (11/09 0615) Pulse Rate: 69 (11/09 0615) Intake/Output from previous day: 11/08 0701 - 11/09 0700 In: 1610.9 [P.O.:720; I.V.:999.3; TPN:4708.4] Out: 950 [Urine:950]  Labs: No results found for this basename: WBC, HGB, HCT, PLT, APTT, INR,  in the last 72 hours  No results found for this basename: NA, K, CL, CO2, GLUCOSE, BUN, CREATININE, LABCREA, CREAT24HRUR, CALCIUM, MG, PHOS, PROT, ALBUMIN, AST, ALT, ALKPHOS, BILITOT, BILIDIR, IBILI, PREALBUMIN, TRIG, CHOLHDL, CHOL,  in the last 72 hours Estimated Creatinine Clearance: 75.3 ml/min (by C-G formula based on Cr of 1.07).    Recent Labs  12/16/12 0552 12/16/12 1306 12/17/12 0842  GLUCAP 123* 102* 94   Insulin Requirements in the past 24 hours:  Sensitive SSI:  0 units Novolog  Nutritional Goals:   RD assessment 11/3:  1650 - 1850 kCal/d, 80-100 grams protein per day, 1.6 - 1.8 L fluid per day  Goal Formula and Rate: Clinimix-E 5/15 at 75 mL/hr + Lipids 20% at 10 mL/hr to provide 1758 KCal/day, 90 grams protein/day  Current Nutrition:   Diet: advanced to Full liquids on 11/7  Maintenance IVF: 1/2 NS + KCl 35mEq/L at 40 mL/hr  TPN at goal rate 75 ml/hr and Lipids at 10 ml/hr  Assessment: 42 y/o M admitted with SBO, underwent exploratory laparotomy with lysis of adhesions and reduction of internal hernia on 12/07/2012, now with prolonged postoperative ileus.  Placement of PICC 11/3 and initiation of TPN with pharmacy and dietary to assist with  management.   Glucose - CBGs at goal < 150mg /dl  Renal: (60/4) SCr = 5.40 with good UOP (1.4 ml/kg/hr)  Electrolytes - (11/6) Elytes wnl, Corr Ca 9.8   LFTs - WNL  TGs - 84 (11/4)  Prealbumin - 24.3 (11/4) - WNL  TPN Access: PICC (placed 11/3) 11/9: D7 TPN.  Diet to regular, doing well  Plan:   Spoke with Dr. Daphine Deutscher, OK to wean TPN to off  D/C TPN labs, CBGs/SSI  Hopeful for discharge tomorrow  Juliette Alcide, PharmD, BCPS.   Pager: [phone] 12/17/2012 10:35 AM

## 2012-12-17 NOTE — Progress Notes (Signed)
Patient ID: Gregory Castro, male   DOB: 07-13-1970, 42 y.o.   MRN: [id number] [facility] Progress Note:   10 Days Post-Op  Subjective: Mental status is clear.  Hungry Objective: Vital signs in last 24 hours: Temp:  [97.8 F (36.6 C)-99.2 F (37.3 C)] 97.8 F (36.6 C) (11/09 0615) Pulse Rate:  [69-89] 69 (11/09 0615) Resp:  [18-20] 18 (11/09 0615) BP: (96-121)/(59-70) 96/59 mmHg (11/09 0615) SpO2:  [98 %-100 %] 98 % (11/09 0615)  Intake/Output from previous day: 11/08 0701 - 11/09 0700 In: 4098.1 [P.O.:720; I.V.:999.3; TPN:4708.4] Out: 950 [Urine:950] Intake/Output this shift: Total I/O In: -  Out: 600 [Urine:600]  Physical Exam: Work of breathing is normal.  Incision OK.  Passing flatus and taking POs well.    Lab Results:  Results for orders placed during the hospital encounter of 12/05/12 (from the past 48 hour(s))  GLUCOSE, CAPILLARY     Status: None   Collection Time    12/15/12 11:42 AM      Result Value Range   Glucose-Capillary 97  70 - 99 mg/dL  GLUCOSE, CAPILLARY     Status: None   Collection Time    12/15/12  6:30 PM      Result Value Range   Glucose-Capillary 93  70 - 99 mg/dL  GLUCOSE, CAPILLARY     Status: None   Collection Time    12/16/12 12:18 AM      Result Value Range   Glucose-Capillary 99  70 - 99 mg/dL  GLUCOSE, CAPILLARY     Status: Abnormal   Collection Time    12/16/12  5:52 AM      Result Value Range   Glucose-Capillary 123 (*) 70 - 99 mg/dL  GLUCOSE, CAPILLARY     Status: Abnormal   Collection Time    12/16/12  1:06 PM      Result Value Range   Glucose-Capillary 102 (*) 70 - 99 mg/dL  GLUCOSE, CAPILLARY     Status: None   Collection Time    12/17/12  8:42 AM      Result Value Range   Glucose-Capillary 94  70 - 99 mg/dL    Radiology/Results: No results found.  Anti-infectives: Anti-infectives   Start     Dose/Rate Route Frequency Ordered Stop   12/07/12 1030  [MAR Hold]  cefOXitin (MEFOXIN) 2 g in dextrose 5 % 50 mL  IVPB     (On MAR Hold since 12/07/12 1126)   2 g 100 mL/hr over 30 Minutes Intravenous On call to O.R. 12/07/12 1027 12/07/12 1318      Assessment/Plan: Problem List: Patient Active Problem List   Diagnosis Date Noted  . Ileus, postoperative 12/11/2012  . SBO (small bowel obstruction) 12/05/2012  . Chronic abdominal pain 12/05/2012  . Constipation, chronic 12/05/2012    Advance to regular diet.  Hopeful discharge tomorrow.  10 Days Post-Op    LOS: 12 days   Matt B. Daphine Deutscher, MD, [facility] [phone] beeper [phone]  12/17/2012 8:55 AM

## 2012-12-18 LAB — GLUCOSE, CAPILLARY
Glucose-Capillary: 92 mg/dL (ref 70–99)
Glucose-Capillary: 99 mg/dL (ref 70–99)

## 2012-12-18 MED ORDER — HYDROCODONE-ACETAMINOPHEN 5-325 MG PO TABS: 1.0000 | ORAL_TABLET | Freq: Four times a day (QID) | ORAL | Status: DC | PRN

## 2012-12-18 MED ORDER — POLYETHYLENE GLYCOL 3350 17 G PO PACK
17.0000 g | PACK | Freq: Two times a day (BID) | ORAL | Status: DC
  Administered 2012-12-18 – 2012-12-19 (×2): 17 g via ORAL
  Filled 2012-12-18 (×4): qty 1

## 2012-12-18 MED ORDER — ACETAMINOPHEN 325 MG PO TABS: 650.0000 mg | ORAL_TABLET | Freq: Four times a day (QID) | ORAL | Status: DC | PRN

## 2012-12-18 MED ORDER — SACCHAROMYCES BOULARDII 250 MG PO CAPS: ORAL_CAPSULE | ORAL | Status: DC

## 2012-12-18 MED ORDER — MAGNESIUM HYDROXIDE 400 MG/5ML PO SUSP
30.0000 mL | Freq: Once | ORAL | Status: AC
  Administered 2012-12-18: 30 mL via ORAL
  Filled 2012-12-18: qty 30

## 2012-12-18 MED ORDER — SORBITOL 70 % SOLN
960.0000 mL | TOPICAL_OIL | Freq: Once | ORAL | Status: AC
  Administered 2012-12-18: 960 mL via RECTAL
  Filled 2012-12-18: qty 240

## 2012-12-18 MED ORDER — POLYETHYLENE GLYCOL 3350 17 G PO PACK: PACK | ORAL | Status: DC

## 2012-12-18 MED ORDER — BISACODYL 10 MG RE SUPP: 10.0000 mg | Freq: Once | RECTAL | Status: DC

## 2012-12-18 NOTE — Progress Notes (Signed)
Seen and agree  Christopherjohn Schiele M. Kiylah Loyer, MD, FACS General, Bariatric, & Minimally Invasive Surgery [facility]  

## 2012-12-18 NOTE — Progress Notes (Signed)
11 Days Post-Op  Subjective: Tolerating a regular diet since last PM, but no BM since surgery.    Objective: Vital signs in last 24 hours: Temp:  [97.9 F (36.6 C)-98.1 F (36.7 C)] 98 F (36.7 C) (11/10 0700) Pulse Rate:  [72-80] 72 (11/10 0700) Resp:  [18] 18 (11/10 0700) BP: (106-113)/(65-79) 106/65 mmHg (11/10 0700) SpO2:  [96 %-98 %] 96 % (11/10 0700) Last BM Date: 12/02/12 720 PO Regular diet Afebrile, VSS No labs off TNA Intake/Output from previous day: 11/09 0701 - 11/10 0700 In: 1880 [P.O.:720; I.V.:960; TPN:200] Out: 3100 [Urine:3100] Intake/Output this shift:    General appearance: alert, cooperative and no distress Resp: clear to auscultation bilaterally GI: soft, incision looks fine, + BS, no distension.  Lab Results:  No results found for this basename: WBC, HGB, HCT, PLT,  in the last 72 hours  BMET No results found for this basename: NA, K, CL, CO2, GLUCOSE, BUN, CREATININE, CALCIUM,  in the last 72 hours PT/INR No results found for this basename: LABPROT, INR,  in the last 72 hours   Recent Labs Lab 12/12/12 0440 12/14/12 0555  AST 18 29  ALT 11 31  ALKPHOS 57 71  BILITOT 0.5 0.7  PROT 6.9 7.2  ALBUMIN 3.2* 3.3*     Lipase     Component Value Date/Time   LIPASE 17 12/05/2012 1755     Studies/Results: No results found.  Medications: . acetaminophen  1,000 mg Oral TID  . antiseptic oral rinse  15 mL Mouth Rinse BID  . chlorhexidine  15 mL Mouth Rinse BID  . enoxaparin (LOVENOX) injection  40 mg Subcutaneous Q24H  . feeding supplement (ENSURE COMPLETE)  237 mL Oral TID BM  . lidocaine  15 mL Mouth/Throat Once  . lip balm  1 application Topical BID  . multivitamin with minerals  1 tablet Oral Daily  . saccharomyces boulardii  250 mg Oral BID  . sodium chloride  1,000 mL Intravenous Once    Assessment/Plan Bowel obstruction.  Exploratory laparotomy with lysis of adhesions and reduction of internal hernia. 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 SBO medical management 2013  Post op ileus  Hx of Asthma  Lovenox for DVT  Malnutrition with TNA now   PLan:  MOM, dulcolax supp and Miralax.  Once hi is having a BM, he should be able to go home.  MOM/miralax/and dulcolax ineffective, will try SMOG enema.     LOS: 13 days    Xochilth Standish 12/18/2012

## 2012-12-18 NOTE — Progress Notes (Signed)
Actions on previous progress note all happened around 1500.

## 2012-12-18 NOTE — Progress Notes (Signed)
Began giving pt SMOG enema per order. About was given before pt said that he "couldn't hold it anymore, he had to go to the bathroom". Pt had a good sized, solid BM. No bleeding noted. VSS.

## 2012-12-18 NOTE — Discharge Summary (Signed)
Physician Discharge Summary  Patient ID: Gregory Castro MRN: [id number] DOB/AGE: 1970-08-31 42 y.o.  Admit date: 12/05/2012 Discharge date: 12/19/2012  Admission Diagnoses:  Bowel obstruction.  Exploratory laparotomy with lysis of adhesions and reduction of internal hernia. 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 SBO medical management 2013  Hx of Asthma    Discharge Diagnoses:  Bowel obstruction.  Exploratory laparotomy with lysis of adhesions and reduction of internal hernia. 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 SBO medical management 2013  Post op ileus  Hx of Asthma  Lovenox for DVT  Malnutrition with TNA Rx now completed  Constipation    Principal Problem:   SBO (small bowel obstruction) Active Problems:   Constipation, chronic   Ileus, postoperative   Chronic abdominal pain   PROCEDURES: Exploratory laparotomy with lysis of adhesions and reduction of internal hernia., 12/08/2012,  [name], DO      Hospital Course: Man with chronic abdominal pain and intermittent constipation. He has had a history of numerous abdominal surgeries. He recalls his first surgery when he is [age] years old when his bowels "stopped working". This is when he lived in [place]. He has been more in this region for some time. Was admitted to [place] for what sounds like a bowel obstruction. Nasogastric tube. IV fluids given. Opened up and improved and went home around four days. Had a similar episode a few years ago. When to [facility]. Required surgery. He thinks there was a small bowel resection as well.  He struggles to have bowel movements every day to every third day. Occasionally has used enemas to help open things up. Gets intermittent bloating and crampy abdominal pain. Has gone to the emergency room numerous times. Prior evaluations not strongly suggestive of bowel  obstruction. However the patient has not had any flatus or bowel movement for the past two days and feels more distended. Uncomfortable. Can barely tolerate sips. Came to emergency room. Concern for bowel obstruction given more distention and discomfort. CAT scan suspicious of this.   He was seen and taken to the OR by Dr. Biagio Quint where he was found to have; Extensive smaller adhesions to the abdominal wall as well as interloop adhesions. The internal hernia involving several loops of  small intestine from adhesions. He tolerated the procedure well.  Post op he had an ileus, and trouble with constipation. He reports trouble with constipation prior to admit and SBO.  We have Treated his constipation with Small SMOG enema after failure of Miralax and suppository.  By discharge he was on bowel regime, and had  Good bowel function at discharge.  Condition on d/c:  Improved    Disposition: Discharged       Future Appointments Provider Department Dept Phone   01/11/2013 11:00 AM [name], [facility], [place] [phone]       Medication List         acetaminophen 325 MG tablet  Commonly known as:  TYLENOL  Take 2 tablets (650 mg total) by mouth every 6 (six) hours as needed (Do not take more than 4000 mg of tylenol per day.  It is in your prescribed pain medicine.).     DSS 100 MG Caps  Take 200 mg by mouth daily as needed for mild constipation (use as long as you need to maintain 1-2 soft bowel movements per day.).  HYDROcodone-acetaminophen 5-325 MG per tablet  Commonly known as:  NORCO/VICODIN  Take 1-2 tablets by mouth every 6 (six) hours as needed.     ibuprofen 600 MG tablet  Commonly known as:  ADVIL,MOTRIN  Take 1 tablet (600 mg total) by mouth every 6 (six) hours as needed for pain.     multivitamin with minerals Tabs tablet  Take 1 tablet by mouth daily.     polyethylene glycol packet  Commonly known as:  MIRALAX / GLYCOLAX  - You can take this 1-2  times per day as needed.  You should have 1-2 soft bowel movements per day.  - You can buy this over the counter.     saccharomyces boulardii 250 MG capsule  Commonly known as:  FLORASTOR  1 capsule twice a day, for the next 2 weeks.  You can buy this over the counter at the drug store.       Follow-up Information   Follow up with You should find a primary care doctor for long term follow up..      Follow up with LAYTON, BRIAN DAVID, DO. Schedule an appointment as soon as possible for a visit in 2 weeks.   Specialty:  General Surgery   Contact information:   [address] [place] [address] [phone]       Signed: Sherrie George 12/25/2012, 9:46 AM

## 2012-12-19 MED ORDER — DSS 100 MG PO CAPS: 200.0000 mg | ORAL_CAPSULE | Freq: Every day | ORAL | Status: DC | PRN

## 2012-12-19 MED ORDER — DOCUSATE SODIUM 100 MG PO CAPS
200.0000 mg | ORAL_CAPSULE | Freq: Every day | ORAL | Status: DC | PRN
  Filled 2012-12-19: qty 2

## 2012-12-19 NOTE — Plan of Care (Addendum)
Problem: Discharge Progression Outcomes Goal: Staples/sutures removed Outcome: Progressing Will D/c staples and suture as ordered prior to d/c home    Staples and sutures removed as ordered without difficulty. Mos amt of dried blood present at incision/cleansed and applied steri strips and cover gauze dressing. RUE PICC line removal site dressing clean, dry and intact.

## 2012-12-19 NOTE — Progress Notes (Signed)
Pt reported having blood in his stool. Notified the on the on call Dr Gerrit Friends , new order given to hold Lovenox ,see MAR.

## 2012-12-19 NOTE — Progress Notes (Signed)
Cosign Alphonse Guild, RN

## 2012-12-19 NOTE — Progress Notes (Signed)
12 Days Post-Op  Subjective: Complained of some bloody loose stools last PM didn't take Miralax in the PM.  No nausea or vomiting, tolerating diet well.     Objective: Vital signs in last 24 hours: Temp:  [97.6 F (36.4 C)-98.3 F (36.8 C)] 97.6 F (36.4 C) (11/11 0716) Pulse Rate:  [72-78] 78 (11/11 0716) Resp:  [18] 18 (11/11 0716) BP: (115-131)/(70-79) 131/76 mmHg (11/11 0716) SpO2:  [96 %-98 %] 98 % (11/11 0716) Last BM Date: 12/02/12 2 BM's yesterday Afebrile, VSS No labs Intake/Output from previous day: 11/10 0701 - 11/11 0700 In: 480 [P.O.:480] Out: 502 [Urine:500; Stool:2] Intake/Output this shift: Total I/O In: -  Out: 550 [Urine:550]  General appearance: alert, cooperative and no distress Resp: clear to auscultation bilaterally GI: soft, non-tender; bowel sounds normal; no masses,  no organomegaly and mid line incision looks fine, Rectal exam show no blood or stool in the vault.  Lab Results:  No results found for this basename: WBC, HGB, HCT, PLT,  in the last 72 hours  BMET No results found for this basename: NA, K, CL, CO2, GLUCOSE, BUN, CREATININE, CALCIUM,  in the last 72 hours PT/INR No results found for this basename: LABPROT, INR,  in the last 72 hours   Recent Labs Lab 12/14/12 0555  AST 29  ALT 31  ALKPHOS 71  BILITOT 0.7  PROT 7.2  ALBUMIN 3.3*     Lipase     Component Value Date/Time   LIPASE 17 12/05/2012 1755     Studies/Results: No results found.  Medications: . acetaminophen  1,000 mg Oral TID  . antiseptic oral rinse  15 mL Mouth Rinse BID  . bisacodyl  10 mg Rectal Once  . chlorhexidine  15 mL Mouth Rinse BID  . enoxaparin (LOVENOX) injection  40 mg Subcutaneous Q24H  . feeding supplement (ENSURE COMPLETE)  237 mL Oral TID BM  . lidocaine  15 mL Mouth/Throat Once  . lip balm  1 application Topical BID  . multivitamin with minerals  1 tablet Oral Daily  . polyethylene glycol  17 g Oral BID  . saccharomyces boulardii   250 mg Oral BID  . sodium chloride  1,000 mL Intravenous Once    Assessment/Plan Bowel obstruction.  Exploratory laparotomy with lysis of adhesions and reduction of internal hernia. 12/08/2012, Gregory Pilot, DO  S/p bowel resection 2012 Gregory Castro and as a young child. SBO medical management 2013  Post op ileus  Hx of Asthma  Lovenox for DVT  Malnutrition with TNA now discontinued   Plan:  I will have them remove staples, and midline suture.  Continue Miralax and colace.  He was instructed to titrate medicines so he has 1-2 soft stools per day.  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  He will return to see Dr. Biagio Castro in 2 weeks.   LOS: 14 days    Gregory Castro 12/19/2012

## 2012-12-20 ENCOUNTER — Encounter (INDEPENDENT_AMBULATORY_CARE_PROVIDER_SITE_OTHER): Payer: Self-pay | Admitting: General Surgery

## 2012-12-20 NOTE — Progress Notes (Signed)
Delayed note entry Discussed with pa and agree with plan  Gregory Castro. [name], MD, FACS General, Bariatric, & Minimally Invasive Surgery [facility] [place]

## 2012-12-25 NOTE — Discharge Summary (Signed)
Dillian Feig M. Yeraldine Forney, MD, FACS General, Bariatric, & Minimally Invasive Surgery [facility]  

## 2013-01-11 ENCOUNTER — Ambulatory Visit (INDEPENDENT_AMBULATORY_CARE_PROVIDER_SITE_OTHER): Payer: Medicaid Other | Admitting: General Surgery

## 2013-01-11 ENCOUNTER — Encounter (INDEPENDENT_AMBULATORY_CARE_PROVIDER_SITE_OTHER): Payer: Self-pay | Admitting: General Surgery

## 2013-01-11 VITALS — BP 118/86 | HR 60 | Temp 97.4°F | Resp 16 | Ht 64.0 in | Wt 140.2 lb

## 2013-01-11 DIAGNOSIS — Z4889 Encounter for other specified surgical aftercare: Secondary | ICD-10-CM

## 2013-01-11 DIAGNOSIS — Z5189 Encounter for other specified aftercare: Secondary | ICD-10-CM

## 2013-01-11 NOTE — Progress Notes (Signed)
Subjective:     Patient ID: Gregory Castro, male   DOB: Mar 11, 1970, 42 y.o.   MRN: [id number]  HPI This patient follows up status post exploratory laparotomy and lysis of adhesions and reduction of internal hernia a few weeks ago. His procedure was complicated with a postoperative ileus but he says that he is not having any nausea or vomiting. His bowels are working well and he is moving his bowels about once per day. He still has some periumbilical pain but it sounds as though this is positional. He also has some sutures still in place. He says that he is eating well without any nausea vomiting.  He is wondering when he can resume his exercise.  He says he has a bulge just under his right ribs with coughing  Review of Systems     Objective:   Physical Exam No distress nontoxic-appearing His abdomen is soft and nontender exam his incision is healing nicely. I do not see any evidence of any incisional hernia. He did have 4 nylon sutures around his umbilicus which I removed today in the office.    Assessment:     Status post total laparotomy with lysis of adhesions He actually looks very good today. His abdomen is benign and nondistended his incision looks good. I do not see any evidence of hernia and the bulge but he is concerned with appears to be rectus. Again, I do not see any evidence of incisional hernia although I explained that he would be high risk from this because of his multiple surgeries. I removed his nylon sutures. I recommended that he continue with light-duty for another 4-6 weeks and then he can gradually increase his activity as tolerated after that. He is not having any issues eating or with his bowels and his obstruction seems to be clinically resolved     Plan:     Lifting restrictions for another 4-6 weeks but then he can gradually increase his activity as tolerated Diet as tolerated Followup in 2 months or sooner if he has any discomfort or any bulging concerning for hernia,  we would check CT abdomen at that time.

## 2013-02-08 ENCOUNTER — Encounter (HOSPITAL_COMMUNITY): Payer: Self-pay | Admitting: Emergency Medicine

## 2013-02-08 ENCOUNTER — Emergency Department (HOSPITAL_COMMUNITY): Payer: Medicaid Other

## 2013-02-08 ENCOUNTER — Emergency Department (HOSPITAL_COMMUNITY)
Admission: EM | Admit: 2013-02-08 | Discharge: 2013-02-08 | Disposition: A | Discharge status: Home / Self Care | Payer: Medicaid Other | Attending: Emergency Medicine | Admitting: Emergency Medicine

## 2013-02-08 DIAGNOSIS — R059 Cough, unspecified: Secondary | ICD-10-CM

## 2013-02-08 DIAGNOSIS — IMO0001 Reserved for inherently not codable concepts without codable children: Secondary | ICD-10-CM | POA: Insufficient documentation

## 2013-02-08 DIAGNOSIS — B9789 Other viral agents as the cause of diseases classified elsewhere: Secondary | ICD-10-CM | POA: Insufficient documentation

## 2013-02-08 DIAGNOSIS — R05 Cough: Secondary | ICD-10-CM

## 2013-02-08 DIAGNOSIS — J45901 Unspecified asthma with (acute) exacerbation: Secondary | ICD-10-CM | POA: Insufficient documentation

## 2013-02-08 DIAGNOSIS — Z8719 Personal history of other diseases of the digestive system: Secondary | ICD-10-CM | POA: Insufficient documentation

## 2013-02-08 DIAGNOSIS — B349 Viral infection, unspecified: Secondary | ICD-10-CM

## 2013-02-08 MED ORDER — HYDROCOD POLST-CHLORPHEN POLST 10-8 MG/5ML PO LQCR: 5.0000 mL | Freq: Two times a day (BID) | ORAL | Status: DC | PRN

## 2013-02-08 MED ORDER — ACETAMINOPHEN 325 MG PO TABS
650.0000 mg | ORAL_TABLET | Freq: Once | ORAL | Status: AC
  Administered 2013-02-08: 650 mg via ORAL
  Filled 2013-02-08: qty 2

## 2013-02-08 NOTE — ED Provider Notes (Signed)
CSN: [id number]     Arrival date & time 02/08/13  1149 History   First MD Initiated Contact with Patient 02/08/13 1222     Chief Complaint  Patient presents with  . Shortness of Breath   (Consider location/radiation/quality/duration/timing/severity/associated sxs/prior Treatment) HPI Comments: Pt states that for the last 3 days he has had a yellow productive cough, sob, myalgias and subjective fever:pain in chest with coughing.pt has not taken anything at home for symptoms  The history is provided by the patient. No language interpreter was used.    Past Medical History  Diagnosis Date  . Bronchitis   . Asthma   . SBO (small bowel obstruction)   . Gastroenteritis   . Ileus, postoperative    Past Surgical History  Procedure Laterality Date  . Abdominal surgery  43 y/o    ?Malrotation?  . Bowel [facility]  2012?    [facility]  . Laparotomy N/A 12/07/2012    Procedure: EXPLORATORY LAPAROTOMY LYSIS OF ADHESIONS, and a reduction of internal hernia;  Surgeon: Lodema PilotBrian Layton, DO;  Location: [facility];  Service: General;  Laterality: N/A;   Family History  Problem Relation Age of Onset  . Other Father    History  Substance Use Topics  . Smoking status: Never Smoker   . Smokeless tobacco: Never Used  . Alcohol Use: No    Review of Systems  Constitutional: Negative.   HENT: Positive for congestion.   Respiratory: Positive for shortness of breath.   Cardiovascular: Positive for chest pain.    Allergies  Review of patient's allergies indicates no known allergies.  Home Medications   Current Outpatient Rx  Name  Route  Sig  Dispense  Refill  . acetaminophen (TYLENOL) 325 MG tablet   Oral   Take 2 tablets (650 mg total) by mouth every 6 (six) hours as needed (Do not take more than 4000 mg of tylenol per day.  It is in your prescribed pain medicine.).         Marland Kitchen. docusate sodium 100 MG CAPS   Oral   Take 200 mg by mouth daily as needed for mild constipation  (use as long as you need to maintain 1-2 soft bowel movements per day.).   10 capsule   0   . HYDROcodone-acetaminophen (NORCO/VICODIN) 5-325 MG per tablet   Oral   Take 1-2 tablets by mouth every 6 (six) hours as needed.   30 tablet   0   . ibuprofen (ADVIL,MOTRIN) 600 MG tablet   Oral   Take 1 tablet (600 mg total) by mouth every 6 (six) hours as needed for pain.   30 tablet   0   . Multiple Vitamin (MULTIVITAMIN WITH MINERALS) TABS tablet   Oral   Take 1 tablet by mouth daily.         . polyethylene glycol (MIRALAX / GLYCOLAX) packet      You can take this 1-2 times per day as needed.  You should have 1-2 soft bowel movements per day. You can buy this over the counter.   14 each   0   . saccharomyces boulardii (FLORASTOR) 250 MG capsule      1 capsule twice a day, for the next 2 weeks.  You can buy this over the counter at the drug store.          BP 98/61  Pulse 101  Temp(Src) 99 F (37.2 C) (Oral)  Resp 18  Wt 137 lb 12.8 oz (  62.506 kg)  SpO2 98% Physical Exam  Nursing note and vitals reviewed. Constitutional: He is oriented to person, place, and time. He appears well-developed and well-nourished.  HENT:  Right Ear: External ear normal.  Left Ear: External ear normal.  Nose: Rhinorrhea present.  Mouth/Throat: Posterior oropharyngeal erythema present.  Eyes: Conjunctivae and EOM are normal.  Cardiovascular: Normal rate and regular rhythm.   Pulmonary/Chest: Effort normal and breath sounds normal.  Abdominal: Bowel sounds are normal. There is tenderness.  Musculoskeletal: Normal range of motion.  Neurological: He is alert and oriented to person, place, and time.  Skin: Skin is warm and dry.    ED Course  Procedures (including critical care time) Labs Review Labs Reviewed - No data to display Imaging Review No results found.  EKG Interpretation    Date/Time:  Thursday February 08 2013 11:56:02 EST Ventricular Rate:  99 PR Interval:  130 QRS  Duration: 82 QT Interval:  310 QTC Calculation: 397 R Axis:   73 Text Interpretation:  Normal sinus rhythm Right atrial enlargement Junctional ST depression, probably normal Borderline ECG No significant change since last tracing Confirmed by SHELDON  MD, CHARLES ([id number]) on 02/08/2013 12:03:07 PM            MDM   1. Viral illness   2. Cough    No pneumonia noted on x-ray:will treat pt symptomatically:pt has had symptoms for 3 days so don't think tamiflu is warranted at this time    Teressa Lower, NP 02/08/13 1335  Teressa Lower, NP 02/08/13 1438

## 2013-02-08 NOTE — ED Notes (Signed)
Vitals completed at 1330

## 2013-02-08 NOTE — ED Notes (Signed)
Pt reports 3 days hx of yellow productive cough, SOB, subjective fever. Pt reports chest pains with coughing, headaches, bodyaches.

## 2013-02-08 NOTE — ED Provider Notes (Signed)
Medical screening examination/treatment/procedure(s) were performed by non-physician practitioner and as supervising physician I was immediately available for consultation/collaboration.  Hurman HornJohn M Sadiyah Kangas, MD 02/08/13 2109

## 2013-02-08 NOTE — Discharge Instructions (Signed)
Antibiotic Nonuse ° Your caregiver felt that the infection or problem was not one that would be helped with an antibiotic. °Infections may be caused by viruses or bacteria. Only a caregiver can tell which one of these is the likely cause of an illness. A cold is the most common cause of infection in both adults and children. A cold is a virus. Antibiotic treatment will have no effect on a viral infection. Viruses can lead to many lost days of work caring for sick children and many missed days of school. Children may catch as many as 10 "colds" or "flus" per year during which they can be tearful, cranky, and uncomfortable. The goal of treating a virus is aimed at keeping the ill person comfortable. °Antibiotics are medications used to help the body fight bacterial infections. There are relatively few types of bacteria that cause infections but there are hundreds of viruses. While both viruses and bacteria cause infection they are very different types of germs. A viral infection will typically go away by itself within 7 to 10 days. Bacterial infections may spread or get worse without antibiotic treatment. °Examples of bacterial infections are: °· Sore throats (like strep throat or tonsillitis). °· Infection in the lung (pneumonia). °· Ear and skin infections. °Examples of viral infections are: °· Colds or flus. °· Most coughs and bronchitis. °· Sore throats not caused by Strep. °· Runny noses. °It is often best not to take an antibiotic when a viral infection is the cause of the problem. Antibiotics can kill off the helpful bacteria that we have inside our body and allow harmful bacteria to start growing. Antibiotics can cause side effects such as allergies, nausea, and diarrhea without helping to improve the symptoms of the viral infection. Additionally, repeated uses of antibiotics can cause bacteria inside of our body to become resistant. That resistance can be passed onto harmful bacterial. The next time you have  an infection it may be harder to treat if antibiotics are used when they are not needed. Not treating with antibiotics allows our own immune system to develop and take care of infections more efficiently. Also, antibiotics will work better for us when they are prescribed for bacterial infections. °Treatments for a child that is ill may include: °· Give extra fluids throughout the day to stay hydrated. °· Get plenty of rest. °· Only give your child over-the-counter or prescription medicines for pain, discomfort, or fever as directed by your caregiver. °· The use of a cool mist humidifier may help stuffy noses. °· Cold medications if suggested by your caregiver. °Your caregiver may decide to start you on an antibiotic if: °· The problem you were seen for today continues for a longer length of time than expected. °· You develop a secondary bacterial infection. °SEEK MEDICAL CARE IF: °· Fever lasts longer than 5 days. °· Symptoms continue to get worse after 5 to 7 days or become severe. °· Difficulty in breathing develops. °· Signs of dehydration develop (poor drinking, rare urinating, dark colored urine). °· Changes in behavior or worsening tiredness (listlessness or lethargy). °Document Released: 04/05/2001 Document Revised: 04/19/2011 Document Reviewed: 10/02/2008 °ExitCare® Patient Information ©2014 [employer]. ° °Cough, Adult ° A cough is a reflex that helps clear your throat and airways. It can help heal the body or may be a reaction to an irritated airway. A cough may only last 2 or 3 weeks (acute) or may last more than 8 weeks (chronic).  °CAUSES °Acute cough: °· Viral or   bacterial infections. °Chronic cough: °· Infections. °· Allergies. °· Asthma. °· Post-nasal drip. °· Smoking. °· Heartburn or acid reflux. °· Some medicines. °· Chronic lung problems (COPD). °· Cancer. °SYMPTOMS  °· Cough. °· Fever. °· Chest pain. °· Increased breathing rate. °· High-pitched whistling sound when breathing  (wheezing). °· Colored mucus that you cough up (sputum). °TREATMENT  °· A bacterial cough may be treated with antibiotic medicine. °· A viral cough must run its course and will not respond to antibiotics. °· Your caregiver may recommend other treatments if you have a chronic cough. °HOME CARE INSTRUCTIONS  °· Only take over-the-counter or prescription medicines for pain, discomfort, or fever as directed by your caregiver. Use cough suppressants only as directed by your caregiver. °· Use a cold steam vaporizer or humidifier in your bedroom or home to help loosen secretions. °· Sleep in a semi-upright position if your cough is worse at night. °· Rest as needed. °· Stop smoking if you smoke. °SEEK IMMEDIATE MEDICAL CARE IF:  °· You have pus in your sputum. °· Your cough starts to worsen. °· You cannot control your cough with suppressants and are losing sleep. °· You begin coughing up blood. °· You have difficulty breathing. °· You develop pain which is getting worse or is uncontrolled with medicine. °· You have a fever. °MAKE SURE YOU:  °· Understand these instructions. °· Will watch your condition. °· Will get help right away if you are not doing well or get worse. °Document Released: 07/24/2010 Document Revised: 04/19/2011 Document Reviewed: 07/24/2010 °ExitCare® Patient Information ©2014 [employer]. ° °

## 2013-02-08 NOTE — ED Notes (Signed)
Pt stated that he has been coughing up yellow sputum for several days. Pt stated that his chest has been burning. No n/v or abdominal pain. He stated that he has been having body aches and chills. No cardiac or respiratory distress at this time. Will continue to monitor.

## 2013-02-08 NOTE — ED Notes (Signed)
All belongings, including clothing, wallet, and cellphone sent home with pt.

## 2013-02-21 ENCOUNTER — Encounter (HOSPITAL_COMMUNITY): Payer: Self-pay | Admitting: Emergency Medicine

## 2013-02-21 ENCOUNTER — Emergency Department (HOSPITAL_COMMUNITY)
Admission: EM | Admit: 2013-02-21 | Discharge: 2013-02-22 | Disposition: A | Discharge status: Home / Self Care | Payer: Medicaid Other | Attending: Emergency Medicine | Admitting: Emergency Medicine

## 2013-02-21 ENCOUNTER — Emergency Department (HOSPITAL_COMMUNITY): Payer: Medicaid Other

## 2013-02-21 DIAGNOSIS — R109 Unspecified abdominal pain: Secondary | ICD-10-CM

## 2013-02-21 DIAGNOSIS — R1032 Left lower quadrant pain: Secondary | ICD-10-CM | POA: Insufficient documentation

## 2013-02-21 DIAGNOSIS — Z8719 Personal history of other diseases of the digestive system: Secondary | ICD-10-CM | POA: Insufficient documentation

## 2013-02-21 DIAGNOSIS — J45909 Unspecified asthma, uncomplicated: Secondary | ICD-10-CM | POA: Insufficient documentation

## 2013-02-21 LAB — CBC WITH DIFFERENTIAL/PLATELET
Basophils Absolute: 0 10*3/uL (ref 0.0–0.1)
Basophils Relative: 0 % (ref 0–1)
EOS PCT: 7 % — AB (ref 0–5)
Eosinophils Absolute: 0.3 10*3/uL (ref 0.0–0.7)
HEMATOCRIT: 39.7 % (ref 39.0–52.0)
Hemoglobin: 13.8 g/dL (ref 13.0–17.0)
LYMPHS ABS: 1.1 10*3/uL (ref 0.7–4.0)
Lymphocytes Relative: 24 % (ref 12–46)
MCH: 29.6 pg (ref 26.0–34.0)
MCHC: 34.8 g/dL (ref 30.0–36.0)
MCV: 85.2 fL (ref 78.0–100.0)
MONO ABS: 0.5 10*3/uL (ref 0.1–1.0)
Monocytes Relative: 12 % (ref 3–12)
Neutro Abs: 2.7 10*3/uL (ref 1.7–7.7)
Neutrophils Relative %: 58 % (ref 43–77)
PLATELETS: 192 10*3/uL (ref 150–400)
RBC: 4.66 MIL/uL (ref 4.22–5.81)
RDW: 12.3 % (ref 11.5–15.5)
WBC: 4.6 10*3/uL (ref 4.0–10.5)

## 2013-02-21 LAB — LIPASE, BLOOD: Lipase: 36 U/L (ref 11–59)

## 2013-02-21 MED ORDER — SODIUM CHLORIDE 0.9 % IV BOLUS (SEPSIS)
1000.0000 mL | Freq: Once | INTRAVENOUS | Status: AC
  Administered 2013-02-21: 1000 mL via INTRAVENOUS

## 2013-02-21 MED ORDER — MORPHINE SULFATE 4 MG/ML IJ SOLN
4.0000 mg | Freq: Once | INTRAMUSCULAR | Status: AC
  Administered 2013-02-21: 4 mg via INTRAVENOUS
  Filled 2013-02-21: qty 1

## 2013-02-21 NOTE — ED Provider Notes (Signed)
CSN: [id number]     Arrival date & time 02/21/13  2209 History   First MD Initiated Contact with Patient 02/21/13 2213     Chief Complaint  Patient presents with  . Abdominal Pain   (Consider location/radiation/quality/duration/timing/severity/associated sxs/prior Treatment) HPI Comments: Patient presents to the emergency department with chief complaint of abdominal pain. He states that the pain started 5 days ago. States the pain is mostly located in the left lower side of his abdomen. He denies fevers, chills, nausea, vomiting, diarrhea, or constipation. States that last bowel movement was 3 days ago. He states that he might have seen a little blood at the end of his BM. States the pain is intermittent, and is currently 8/10. Additionally, he states that he is been taking Vicodin and ibuprofen for a recent dental procedure.  The history is provided by the patient. No language interpreter was used.    Past Medical History  Diagnosis Date  . Bronchitis   . Asthma   . SBO (small bowel obstruction)   . Gastroenteritis   . Ileus, postoperative    Past Surgical History  Procedure Laterality Date  . Abdominal surgery  43 y/o    ?Malrotation?  . Bowel [facility]  2012[facility]  . Laparotomy N/A 12/07/2012    Procedure: EXPLORATORY LAPAROTOMY LYSIS OF ADHESIONS, and a reduction of internal hernia;  Surgeon: Lodema PilotBrian Layton, DO;  Location: [facility];  Service: General;  Laterality: N/A;   Family History  Problem Relation Age of Onset  . Other Father    History  Substance Use Topics  . Smoking status: Never Smoker   . Smokeless tobacco: Never Used  . Alcohol Use: No    Review of Systems  All other systems reviewed and are negative.    Allergies  Review of patient's allergies indicates no known allergies.  Home Medications   Current Outpatient Rx  Name  Route  Sig  Dispense  Refill  . acetaminophen (TYLENOL) 325 MG tablet   Oral   Take 2 tablets (650 mg  total) by mouth every 6 (six) hours as needed (Do not take more than 4000 mg of tylenol per day.  It is in your prescribed pain medicine.).         Marland Kitchen. chlorpheniramine-HYDROcodone (TUSSIONEX PENNKINETIC ER) 10-8 MG/5ML LQCR   Oral   Take 5 mLs by mouth every 12 (twelve) hours as needed for cough.   120 mL   0   . docusate sodium 100 MG CAPS   Oral   Take 200 mg by mouth daily as needed for mild constipation (use as long as you need to maintain 1-2 soft bowel movements per day.).   10 capsule   0   . HYDROcodone-acetaminophen (NORCO/VICODIN) 5-325 MG per tablet   Oral   Take 1-2 tablets by mouth every 6 (six) hours as needed.   30 tablet   0   . ibuprofen (ADVIL,MOTRIN) 600 MG tablet   Oral   Take 1 tablet (600 mg total) by mouth every 6 (six) hours as needed for pain.   30 tablet   0   . Multiple Vitamin (MULTIVITAMIN WITH MINERALS) TABS tablet   Oral   Take 1 tablet by mouth daily.         . polyethylene glycol (MIRALAX / GLYCOLAX) packet      You can take this 1-2 times per day as needed.  You should have 1-2 soft bowel movements per day.  You can buy this over the counter.   14 each   0   . saccharomyces boulardii (FLORASTOR) 250 MG capsule      1 capsule twice a day, for the next 2 weeks.  You can buy this over the counter at the drug store.          BP 132/84  Pulse 73  Temp(Src) 97.6 F (36.4 C) (Oral)  Resp 18  SpO2 100% Physical Exam  Nursing note and vitals reviewed. Constitutional: He is oriented to person, place, and time. He appears well-developed and well-nourished.  HENT:  Head: Normocephalic and atraumatic.  Eyes: Conjunctivae and EOM are normal. Pupils are equal, round, and reactive to light. Right eye exhibits no discharge. Left eye exhibits no discharge. No scleral icterus.  Neck: Normal range of motion. Neck supple. No JVD present.  Cardiovascular: Normal rate, regular rhythm, normal heart sounds and intact distal pulses.  Exam reveals no  gallop and no friction rub.   No murmur heard. Pulmonary/Chest: Effort normal and breath sounds normal. No respiratory distress. He has no wheezes. He has no rales. He exhibits no tenderness.  Abdominal: Soft. Bowel sounds are normal. He exhibits no distension and no mass. There is tenderness. There is no rebound and no guarding.  Moderately tender to the left lower quadrant, no other abdominal tenderness, no pain at McBurney's point, no right upper quadrant tenderness, or Murphy sign, no fluid wave, or signs of peritonitis  Musculoskeletal: Normal range of motion. He exhibits no edema and no tenderness.  Neurological: He is alert and oriented to person, place, and time. He has normal reflexes.  CN 3-12 intact  Skin: Skin is warm and dry.  Psychiatric: He has a normal mood and affect. His behavior is normal. Judgment and thought content normal.    ED Course  Procedures (including critical care time) Results for orders placed during the hospital encounter of 02/21/13  CBC WITH DIFFERENTIAL      Result Value Range   WBC 4.6  4.0 - 10.5 K/uL   RBC 4.66  4.22 - 5.81 MIL/uL   Hemoglobin 13.8  13.0 - 17.0 g/dL   HCT 45.4  09.8 - 11.9 %   MCV 85.2  78.0 - 100.0 fL   MCH 29.6  26.0 - 34.0 pg   MCHC 34.8  30.0 - 36.0 g/dL   RDW 14.7  82.9 - 56.2 %   Platelets 192  150 - 400 K/uL   Neutrophils Relative % 58  43 - 77 %   Neutro Abs 2.7  1.7 - 7.7 K/uL   Lymphocytes Relative 24  12 - 46 %   Lymphs Abs 1.1  0.7 - 4.0 K/uL   Monocytes Relative 12  3 - 12 %   Monocytes Absolute 0.5  0.1 - 1.0 K/uL   Eosinophils Relative 7 (*) 0 - 5 %   Eosinophils Absolute 0.3  0.0 - 0.7 K/uL   Basophils Relative 0  0 - 1 %   Basophils Absolute 0.0  0.0 - 0.1 K/uL  COMPREHENSIVE METABOLIC PANEL      Result Value Range   Sodium 139  137 - 147 mEq/L   Potassium 3.9  3.7 - 5.3 mEq/L   Chloride 103  96 - 112 mEq/L   CO2 27  19 - 32 mEq/L   Glucose, Bld 96  70 - 99 mg/dL   BUN 12  6 - 23 mg/dL   Creatinine,  Ser 1.30  0.50 -  1.35 mg/dL   Calcium 8.7  8.4 - 47.8 mg/dL   Total Protein 6.7  6.0 - 8.3 g/dL   Albumin 3.3 (*) 3.5 - 5.2 g/dL   AST 13  0 - 37 U/L   ALT 10  0 - 53 U/L   Alkaline Phosphatase 84  39 - 117 U/L   Total Bilirubin <0.2 (*) 0.3 - 1.2 mg/dL   GFR calc non Af Amer 72 (*) >90 mL/min   GFR calc Af Amer 84 (*) >90 mL/min  LIPASE, BLOOD      Result Value Range   Lipase 36  11 - 59 U/L   Dg Chest 2 View  02/08/2013   CLINICAL DATA:  Cough and fever.  EXAM: CHEST  2 VIEW  COMPARISON:  12/05/2012  FINDINGS: The heart size and mediastinal contours are within normal limits. Both lungs are clear. The visualized skeletal structures are unremarkable.  IMPRESSION: No active cardiopulmonary disease.   Electronically Signed   By: Richarda Overlie M.D.   On: 02/08/2013 13:20   Dg Abd Acute W/chest  02/21/2013   CLINICAL DATA:  Left lower quadrant abdominal pain. Prior history of small bowel obstruction.  EXAM: ACUTE ABDOMEN SERIES (ABDOMEN 2 VIEW & CHEST 1 VIEW)  COMPARISON:  Two-view abdomen x-ray 12/13/2012, 12/07/2012. Two-view chest x-ray 02/08/2013. Acute abdomen series 12/05/2012.  FINDINGS: Bowel gas pattern unremarkable without evidence of obstruction or significant ileus. No evidence of free air or significant air-fluid levels on the erect image. Moderate stool burden throughout the colon. Upper normal caliber gas-filled sigmoid colon. Air-fluid levels in the sigmoid colon consistent liquid stool. No visible urinary trace that no opaque urinary tract calculi.  Cardiomediastinal silhouette unremarkable and unchanged. Lungs clear. Bronchovascular markings normal. Pulmonary vascularity normal. No visible pleural effusions. No pneumothorax.  IMPRESSION: No acute abdominal or pulmonary abnormality.   Electronically Signed   By: Hulan Saas M.D.   On: 02/21/2013 23:27      EKG Interpretation   None       MDM   1. Abdominal pain     Patient with recent history SBO. Will check abdominal  series , and basic labs. Will reevaluate.  12:28 AM Labs are reassuring.  Plain films are negative.  Will treat with miralax and enema.  Discussed the patient with Dr. Norlene Campbell, who agrees with the plan.  However, if symptoms persist, then the patient is to return in 24 hours for recheck and probable CT.  Discussed this plan with the patient, who understands and agrees with the plan.  He is stable and ready for discharge.    Roxy Horseman, PA-C 02/22/13 0030

## 2013-02-21 NOTE — ED Notes (Signed)
Per pt report: pt had surgery around halloween for a blockage.  Pt has been having pain in his abd for the past 5 days.  Pt also reports having his tooth pulled 2 days ago so pt has been taking pain medications.  Pt reports pain in the LLQ and a "bulge" in the lower middle area.

## 2013-02-22 LAB — COMPREHENSIVE METABOLIC PANEL
ALT: 10 U/L (ref 0–53)
AST: 13 U/L (ref 0–37)
Albumin: 3.3 g/dL — ABNORMAL LOW (ref 3.5–5.2)
Alkaline Phosphatase: 84 U/L (ref 39–117)
BUN: 12 mg/dL (ref 6–23)
CALCIUM: 8.7 mg/dL (ref 8.4–10.5)
CO2: 27 mEq/L (ref 19–32)
Chloride: 103 mEq/L (ref 96–112)
Creatinine, Ser: 1.21 mg/dL (ref 0.50–1.35)
GFR, EST AFRICAN AMERICAN: 84 mL/min — AB (ref >90)
GFR, EST NON AFRICAN AMERICAN: 72 mL/min — AB (ref >90)
GLUCOSE: 96 mg/dL (ref 70–99)
Potassium: 3.9 mEq/L (ref 3.7–5.3)
Sodium: 139 mEq/L (ref 137–147)
TOTAL PROTEIN: 6.7 g/dL (ref 6.0–8.3)
Total Bilirubin: <0.2 mg/dL — ABNORMAL LOW (ref 0.3–1.2)

## 2013-02-22 NOTE — Discharge Instructions (Signed)
Return to the ED if your symptoms do not improve with the miralax and enema.  Return if the symptoms worsen.   Abdominal Pain, Adult Many things can cause abdominal pain. Usually, abdominal pain is not caused by a disease and will improve without treatment. It can often be observed and treated at home. Your health care provider will do a physical exam and possibly order blood tests and X-rays to help determine the seriousness of your pain. However, in many cases, more time must pass before a clear cause of the pain can be found. Before that point, your health care provider may not know if you need more testing or further treatment. HOME CARE INSTRUCTIONS  Monitor your abdominal pain for any changes. The following actions may help to alleviate any discomfort you are experiencing:  Only take over-the-counter or prescription medicines as directed by your health care provider.  Do not take laxatives unless directed to do so by your health care provider.  Try a clear liquid diet (broth, tea, or water) as directed by your health care provider. Slowly move to a bland diet as tolerated. SEEK MEDICAL CARE IF:  You have unexplained abdominal pain.  You have abdominal pain associated with nausea or diarrhea.  You have pain when you urinate or have a bowel movement.  You experience abdominal pain that wakes you in the night.  You have abdominal pain that is worsened or improved by eating food.  You have abdominal pain that is worsened with eating fatty foods. SEEK IMMEDIATE MEDICAL CARE IF:   Your pain does not go away within 2 hours.  You have a fever.  You keep throwing up (vomiting).  Your pain is felt only in portions of the abdomen, such as the right side or the left lower portion of the abdomen.  You pass bloody or black tarry stools. MAKE SURE YOU:  Understand these instructions.   Will watch your condition.   Will get help right away if you are not doing well or get worse.   Document Released: 11/04/2004 Document Revised: 11/15/2012 Document Reviewed: 10/04/2012 [facility] Patient Information 2014 [place], [place].

## 2013-02-22 NOTE — ED Provider Notes (Signed)
Medical screening examination/treatment/procedure(s) were performed by non-physician practitioner and as supervising physician I was immediately available for consultation/collaboration.  EKG Interpretation   None        Gilda Creasehristopher J. Pollina, MD 02/22/13 [id number]

## 2013-04-15 ENCOUNTER — Encounter (HOSPITAL_COMMUNITY): Payer: Self-pay | Admitting: Emergency Medicine

## 2013-04-15 ENCOUNTER — Emergency Department (HOSPITAL_COMMUNITY): Payer: Medicaid Other

## 2013-04-15 ENCOUNTER — Emergency Department (HOSPITAL_COMMUNITY)
Admission: EM | Admit: 2013-04-15 | Discharge: 2013-04-15 | Disposition: A | Discharge status: Home / Self Care | Payer: Medicaid Other | Attending: Emergency Medicine | Admitting: Emergency Medicine

## 2013-04-15 DIAGNOSIS — Z8719 Personal history of other diseases of the digestive system: Secondary | ICD-10-CM | POA: Insufficient documentation

## 2013-04-15 DIAGNOSIS — Z9889 Other specified postprocedural states: Secondary | ICD-10-CM | POA: Insufficient documentation

## 2013-04-15 DIAGNOSIS — R630 Anorexia: Secondary | ICD-10-CM | POA: Insufficient documentation

## 2013-04-15 DIAGNOSIS — R109 Unspecified abdominal pain: Secondary | ICD-10-CM

## 2013-04-15 DIAGNOSIS — R1032 Left lower quadrant pain: Secondary | ICD-10-CM | POA: Insufficient documentation

## 2013-04-15 DIAGNOSIS — M549 Dorsalgia, unspecified: Secondary | ICD-10-CM | POA: Insufficient documentation

## 2013-04-15 DIAGNOSIS — J45909 Unspecified asthma, uncomplicated: Secondary | ICD-10-CM | POA: Insufficient documentation

## 2013-04-15 DIAGNOSIS — R1031 Right lower quadrant pain: Secondary | ICD-10-CM | POA: Insufficient documentation

## 2013-04-15 DIAGNOSIS — R350 Frequency of micturition: Secondary | ICD-10-CM | POA: Insufficient documentation

## 2013-04-15 LAB — CBC WITH DIFFERENTIAL/PLATELET
Basophils Absolute: 0 10*3/uL (ref 0.0–0.1)
Basophils Relative: 0 % (ref 0–1)
Eosinophils Absolute: 0 10*3/uL (ref 0.0–0.7)
Eosinophils Relative: 0 % (ref 0–5)
HCT: 44.3 % (ref 39.0–52.0)
HEMOGLOBIN: 15.8 g/dL (ref 13.0–17.0)
LYMPHS ABS: 1.4 10*3/uL (ref 0.7–4.0)
LYMPHS PCT: 20 % (ref 12–46)
MCH: 29.9 pg (ref 26.0–34.0)
MCHC: 35.7 g/dL (ref 30.0–36.0)
MCV: 83.7 fL (ref 78.0–100.0)
MONOS PCT: 7 % (ref 3–12)
Monocytes Absolute: 0.5 10*3/uL (ref 0.1–1.0)
NEUTROS PCT: 73 % (ref 43–77)
Neutro Abs: 5.1 10*3/uL (ref 1.7–7.7)
PLATELETS: 151 10*3/uL (ref 150–400)
RBC: 5.29 MIL/uL (ref 4.22–5.81)
RDW: 13.1 % (ref 11.5–15.5)
WBC: 7 10*3/uL (ref 4.0–10.5)

## 2013-04-15 LAB — URINALYSIS, ROUTINE W REFLEX MICROSCOPIC
Bilirubin Urine: NEGATIVE
Glucose, UA: NEGATIVE mg/dL
HGB URINE DIPSTICK: NEGATIVE
Ketones, ur: NEGATIVE mg/dL
Leukocytes, UA: NEGATIVE
NITRITE: NEGATIVE
PROTEIN: NEGATIVE mg/dL
Specific Gravity, Urine: 1.027 (ref 1.005–1.030)
Urobilinogen, UA: 0.2 mg/dL (ref 0.0–1.0)
pH: 6.5 (ref 5.0–8.0)

## 2013-04-15 LAB — COMPREHENSIVE METABOLIC PANEL
ALT: 14 U/L (ref 0–53)
AST: 19 U/L (ref 0–37)
Albumin: 3.8 g/dL (ref 3.5–5.2)
Alkaline Phosphatase: 87 U/L (ref 39–117)
BILIRUBIN TOTAL: 0.6 mg/dL (ref 0.3–1.2)
BUN: 11 mg/dL (ref 6–23)
CALCIUM: 9.2 mg/dL (ref 8.4–10.5)
CHLORIDE: 102 meq/L (ref 96–112)
CO2: 24 meq/L (ref 19–32)
Creatinine, Ser: 1.18 mg/dL (ref 0.50–1.35)
GFR calc Af Amer: 86 mL/min — ABNORMAL LOW (ref >90)
GFR, EST NON AFRICAN AMERICAN: 75 mL/min — AB (ref >90)
Glucose, Bld: 96 mg/dL (ref 70–99)
Potassium: 3.8 mEq/L (ref 3.7–5.3)
Sodium: 140 mEq/L (ref 137–147)
Total Protein: 7.3 g/dL (ref 6.0–8.3)

## 2013-04-15 LAB — I-STAT TROPONIN, ED: Troponin i, poc: 0 ng/mL (ref 0.00–0.08)

## 2013-04-15 MED ORDER — HYDROCODONE-ACETAMINOPHEN 5-325 MG PO TABS: 1.0000 | ORAL_TABLET | Freq: Four times a day (QID) | ORAL | Status: AC | PRN

## 2013-04-15 MED ORDER — HYDROMORPHONE HCL PF 1 MG/ML IJ SOLN
1.0000 mg | Freq: Once | INTRAMUSCULAR | Status: AC
  Administered 2013-04-15: 1 mg via INTRAVENOUS
  Filled 2013-04-15: qty 1

## 2013-04-15 NOTE — Discharge Instructions (Signed)
Take pain medications as prescribed as needed. Follow up with your doctor.     Abdominal Pain, Adult Many things can cause abdominal pain. Usually, abdominal pain is not caused by a disease and will improve without treatment. It can often be observed and treated at home. Your health care provider will do a physical exam and possibly order blood tests and X-rays to help determine the seriousness of your pain. However, in many cases, more time must pass before a clear cause of the pain can be found. Before that point, your health care provider may not know if you need more testing or further treatment. HOME CARE INSTRUCTIONS  Monitor your abdominal pain for any changes. The following actions may help to alleviate any discomfort you are experiencing:  Only take over-the-counter or prescription medicines as directed by your health care provider.  Do not take laxatives unless directed to do so by your health care provider.  Try a clear liquid diet (broth, tea, or water) as directed by your health care provider. Slowly move to a bland diet as tolerated. SEEK MEDICAL CARE IF:  You have unexplained abdominal pain.  You have abdominal pain associated with nausea or diarrhea.  You have pain when you urinate or have a bowel movement.  You experience abdominal pain that wakes you in the night.  You have abdominal pain that is worsened or improved by eating food.  You have abdominal pain that is worsened with eating fatty foods. SEEK IMMEDIATE MEDICAL CARE IF:   Your pain does not go away within 2 hours.  You have a fever.  You keep throwing up (vomiting).  Your pain is felt only in portions of the abdomen, such as the right side or the left lower portion of the abdomen.  You pass bloody or black tarry stools. MAKE SURE YOU:  Understand these instructions.   Will watch your condition.   Will get help right away if you are not doing well or get worse.  Document Released: 11/04/2004  Document Revised: 11/15/2012 Document Reviewed: 10/04/2012 [facility] Patient Information 2014 [place]ExitCare, [place]LL[place].

## 2013-04-15 NOTE — ED Notes (Signed)
MD at bedside. 

## 2013-04-15 NOTE — ED Provider Notes (Signed)
Patient signed out to me by Dr. Park Popeockerty, patient with lower abdominal pain, back pain, frequent urination. History of small bowel obstruction. Patient worked up for his abdominal pain with no significant abnormalities noted on lab work or acute abdominal series. Patient's pain treated with IV Dilaudid. Patient has no nausea vomiting and emergency department. I reassess patient, he has benign abdomen. He is feeling much better. He is tolerating by mouth fluids. Plan to discharge him home with pain medications and close followup with primary care Dr. Patient is agreeable to the plan.  Filed Vitals:   04/15/13 2021 04/15/13 2045 04/15/13 2053 04/15/13 2115  BP: 108/85 111/81 111/81 106/76  Pulse: 76 78 71 65  Temp: 98.1 F (36.7 C)     TempSrc: Oral     Resp: 18  18   SpO2: 98% 97% 96% 98%      Lottie Musselatyana A Alisson Rozell, PA-C 04/15/13 2155

## 2013-04-15 NOTE — ED Notes (Signed)
Pt is here with lower abdominal pain, lower back pain, frequent urination and continues to drip.  Pt feels weak all over, pounding heart beat and feels like he could pass out

## 2013-04-16 NOTE — ED Provider Notes (Signed)
Medical screening examination/treatment/procedure(s) were performed by non-physician practitioner and as supervising physician I was immediately available for consultation/collaboration.   EKG Interpretation None        Layla MawKristen N Dacoda Spallone, DO 04/16/13 2307

## 2013-04-18 ENCOUNTER — Encounter (HOSPITAL_COMMUNITY): Payer: Self-pay | Admitting: Emergency Medicine

## 2013-04-18 ENCOUNTER — Emergency Department (HOSPITAL_COMMUNITY)
Admission: EM | Admit: 2013-04-18 | Discharge: 2013-04-19 | Disposition: A | Discharge status: Home / Self Care | Payer: Medicaid Other | Attending: Emergency Medicine | Admitting: Emergency Medicine

## 2013-04-18 DIAGNOSIS — Z8719 Personal history of other diseases of the digestive system: Secondary | ICD-10-CM | POA: Insufficient documentation

## 2013-04-18 DIAGNOSIS — Z202 Contact with and (suspected) exposure to infections with a predominantly sexual mode of transmission: Secondary | ICD-10-CM

## 2013-04-18 DIAGNOSIS — J45901 Unspecified asthma with (acute) exacerbation: Secondary | ICD-10-CM | POA: Insufficient documentation

## 2013-04-18 DIAGNOSIS — R197 Diarrhea, unspecified: Secondary | ICD-10-CM | POA: Insufficient documentation

## 2013-04-18 DIAGNOSIS — Z9889 Other specified postprocedural states: Secondary | ICD-10-CM | POA: Insufficient documentation

## 2013-04-18 DIAGNOSIS — R109 Unspecified abdominal pain: Secondary | ICD-10-CM | POA: Insufficient documentation

## 2013-04-18 LAB — COMPREHENSIVE METABOLIC PANEL
ALK PHOS: 104 U/L (ref 39–117)
ALT: 14 U/L (ref 0–53)
AST: 19 U/L (ref 0–37)
Albumin: 3.9 g/dL (ref 3.5–5.2)
BUN: 12 mg/dL (ref 6–23)
CHLORIDE: 99 meq/L (ref 96–112)
CO2: 26 meq/L (ref 19–32)
Calcium: 8.8 mg/dL (ref 8.4–10.5)
Creatinine, Ser: 1.15 mg/dL (ref 0.50–1.35)
GFR calc Af Amer: 89 mL/min — ABNORMAL LOW (ref >90)
GFR, EST NON AFRICAN AMERICAN: 77 mL/min — AB (ref >90)
GLUCOSE: 92 mg/dL (ref 70–99)
Potassium: 3.8 mEq/L (ref 3.7–5.3)
SODIUM: 139 meq/L (ref 137–147)
Total Bilirubin: 0.6 mg/dL (ref 0.3–1.2)
Total Protein: 7.6 g/dL (ref 6.0–8.3)

## 2013-04-18 LAB — LIPASE, BLOOD: Lipase: 34 U/L (ref 11–59)

## 2013-04-18 LAB — I-STAT CHEM 8, ED
BUN: 11 mg/dL (ref 6–23)
Calcium, Ion: 1.13 mmol/L (ref 1.12–1.23)
Chloride: 98 mEq/L (ref 96–112)
Creatinine, Ser: 1.3 mg/dL (ref 0.50–1.35)
Glucose, Bld: 91 mg/dL (ref 70–99)
HCT: 48 % (ref 39.0–52.0)
HEMOGLOBIN: 16.3 g/dL (ref 13.0–17.0)
Potassium: 3.6 mEq/L — ABNORMAL LOW (ref 3.7–5.3)
SODIUM: 140 meq/L (ref 137–147)
TCO2: 29 mmol/L (ref 0–100)

## 2013-04-18 LAB — CBG MONITORING, ED
GLUCOSE-CAPILLARY: 113 mg/dL — AB (ref 70–99)
Glucose-Capillary: 129 mg/dL — ABNORMAL HIGH (ref 70–99)

## 2013-04-18 MED ORDER — LIDOCAINE HCL (PF) 1 % IJ SOLN
INTRAMUSCULAR | Status: AC
  Administered 2013-04-18: 0.9 mL
  Filled 2013-04-18: qty 5

## 2013-04-18 MED ORDER — AZITHROMYCIN 1 G PO PACK
1.0000 g | PACK | Freq: Once | ORAL | Status: AC
  Administered 2013-04-18: 1 g via ORAL
  Filled 2013-04-18: qty 1

## 2013-04-18 MED ORDER — IOHEXOL 300 MG/ML  SOLN
25.0000 mL | Freq: Once | INTRAMUSCULAR | Status: AC | PRN
  Administered 2013-04-18: 25 mL via ORAL

## 2013-04-18 MED ORDER — FENTANYL CITRATE 0.05 MG/ML IJ SOLN
75.0000 ug | Freq: Once | INTRAMUSCULAR | Status: AC
  Administered 2013-04-18: 75 ug via INTRAVENOUS
  Filled 2013-04-18: qty 2

## 2013-04-18 MED ORDER — CEFTRIAXONE SODIUM 250 MG IJ SOLR
250.0000 mg | Freq: Once | INTRAMUSCULAR | Status: AC
  Administered 2013-04-18: 250 mg via INTRAMUSCULAR
  Filled 2013-04-18: qty 250

## 2013-04-18 NOTE — ED Notes (Signed)
The pt  Was seen here 2 days ago for abd pain and he  Reports that he is no better.   He had lab work then.

## 2013-04-18 NOTE — ED Notes (Signed)
He was just called from [place] by his girlfriend and she told him she had tric so hes here for treatment of the same

## 2013-04-18 NOTE — ED Notes (Signed)
Nurse First Rounds : Nurse explained delay / wait time and process to pt. No distress/respirations unlabored / VSS.

## 2013-04-18 NOTE — ED Provider Notes (Signed)
CSN: [id number]     Arrival date & time 04/18/13  1627 History   First MD Initiated Contact with Patient 04/18/13 2138     Chief Complaint  Patient presents with  . Abdominal Pain     (Consider location/radiation/quality/duration/timing/severity/associated sxs/prior Treatment) HPI Comments: 43 yo male with SBO times 3, most recent 2 mo ago, constipation presents with central abd pain, intermittent, non radiating, sharp.  Pt unsure if similar to SBO hx however pt is passing gas, non vomiting, no bleeding.  Pt seen 2 days prior for blood work and xray.  Pt also has concern for STD exposure, no sxs currently however partner mentioned STD diagnosis.    Patient is a 43 y.o. male presenting with abdominal pain. The history is provided by the patient.  Abdominal Pain Associated symptoms: diarrhea   Associated symptoms: no chest pain, no chills, no dysuria, no fever, no shortness of breath and no vomiting     Past Medical History  Diagnosis Date  . Bronchitis   . Asthma   . SBO (small bowel obstruction)   . Gastroenteritis   . Ileus, postoperative    Past Surgical History  Procedure Laterality Date  . Abdominal surgery  43 y/o    ?Malrotation?  . Bowel resection  2012?    [facility]  . Laparotomy N/A 12/07/2012    Procedure: EXPLORATORY LAPAROTOMY LYSIS OF ADHESIONS, and a reduction of internal hernia;  Surgeon: [name], DO;  Location: [facility];  Service: General;  Laterality: N/A;   Family History  Problem Relation Age of Onset  . Other Father    History  Substance Use Topics  . Smoking status: Never Smoker   . Smokeless tobacco: Never Used  . Alcohol Use: No    Review of Systems  Constitutional: Negative for fever and chills.  HENT: Negative for congestion.   Eyes: Negative for visual disturbance.  Respiratory: Negative for shortness of breath.   Cardiovascular: Negative for chest pain.  Gastrointestinal: Positive for abdominal pain and diarrhea.  Negative for vomiting.  Genitourinary: Negative for dysuria and flank pain.  Musculoskeletal: Negative for back pain, neck pain and neck stiffness.  Skin: Negative for rash.  Neurological: Negative for light-headedness and headaches.      Allergies  Shellfish allergy  Home Medications   Current Outpatient Rx  Name  Route  Sig  Dispense  Refill  . HYDROcodone-acetaminophen (NORCO) 5-325 MG per tablet   Oral   Take 1 tablet by mouth every 6 (six) hours as needed for moderate pain.   20 tablet   0   . ibuprofen (ADVIL,MOTRIN) 800 MG tablet   Oral   Take 800 mg by mouth every 8 (eight) hours as needed for mild pain.         Marland Kitchen oxyCODONE-acetaminophen (PERCOCET/ROXICET) 5-325 MG per tablet   Oral   Take 1 tablet by mouth every 4 (four) hours as needed for severe pain.          BP 119/81  Pulse 77  Temp(Src) 98.1 F (36.7 C) (Oral)  Resp 16  SpO2 100% Physical Exam  Nursing note and vitals reviewed. Constitutional: He is oriented to person, place, and time. He appears well-developed and well-nourished.  HENT:  Head: Normocephalic and atraumatic.  Eyes: Conjunctivae are normal. Right eye exhibits no discharge. Left eye exhibits no discharge.  Neck: Normal range of motion. Neck supple. No tracheal deviation present.  Cardiovascular: Normal rate and regular rhythm.   Pulmonary/Chest: Effort  normal and breath sounds normal.  Abdominal: Soft. He exhibits no distension. There is tenderness (mild lower central). There is no guarding.  Musculoskeletal: He exhibits no edema.  Neurological: He is alert and oriented to person, place, and time.  Skin: Skin is warm. No rash noted.  Psychiatric: He has a normal mood and affect.    ED Course  Procedures (including critical care time) Labs Review Labs Reviewed  CBG MONITORING, ED - Abnormal; Notable for the following:    Glucose-Capillary 129 (*)    All other components within normal limits  CBG MONITORING, ED - Abnormal;  Notable for the following:    Glucose-Capillary 113 (*)    All other components within normal limits  I-STAT CHEM 8, ED   Imaging Review No results found.   EKG Interpretation None      MDM   Final diagnoses:  Exposure to STD  Abdominal pain   Concern for possible partial obstruction with hx.  Second visit, discussed r/b of CT abdo, pt agrees. Signed out to ED physician to fup CT abdo results.        Enid SkeensJoshua M Cherie Lasalle, MD 04/21/13 [id number]

## 2013-04-19 ENCOUNTER — Emergency Department (HOSPITAL_COMMUNITY): Payer: Medicaid Other

## 2013-04-19 ENCOUNTER — Encounter (HOSPITAL_COMMUNITY): Payer: Self-pay | Admitting: Radiology

## 2013-04-19 MED ORDER — IOHEXOL 300 MG/ML  SOLN
80.0000 mL | Freq: Once | INTRAMUSCULAR | Status: AC | PRN
  Administered 2013-04-19: 80 mL via INTRAVENOUS

## 2013-04-19 NOTE — ED Notes (Signed)
Pt A&Ox4, ambulatory at discharge, verbalizing no complaints at this time. 

## 2013-04-19 NOTE — Discharge Instructions (Signed)
Abdominal Pain, Adult °Many things can cause abdominal pain. Usually, abdominal pain is not caused by a disease and will improve without treatment. It can often be observed and treated at home. Your health care provider will do a physical exam and possibly order blood tests and X-rays to help determine the seriousness of your pain. However, in many cases, more time must pass before a clear cause of the pain can be found. Before that point, your health care provider may not know if you need more testing or further treatment. °HOME CARE INSTRUCTIONS  °Monitor your abdominal pain for any changes. The following actions may help to alleviate any discomfort you are experiencing: °· Only take over-the-counter or prescription medicines as directed by your health care provider. °· Do not take laxatives unless directed to do so by your health care provider. °· Try a clear liquid diet (broth, tea, or water) as directed by your health care provider. Slowly move to a bland diet as tolerated. °SEEK MEDICAL CARE IF: °· You have unexplained abdominal pain. °· You have abdominal pain associated with nausea or diarrhea. °· You have pain when you urinate or have a bowel movement. °· You experience abdominal pain that wakes you in the night. °· You have abdominal pain that is worsened or improved by eating food. °· You have abdominal pain that is worsened with eating fatty foods. °SEEK IMMEDIATE MEDICAL CARE IF:  °· Your pain does not go away within 2 hours. °· You have a fever. °· You keep throwing up (vomiting). °· Your pain is felt only in portions of the abdomen, such as the right side or the left lower portion of the abdomen. °· You pass bloody or black tarry stools. °MAKE SURE YOU: °· Understand these instructions.   °· Will watch your condition.   °· Will get help right away if you are not doing well or get worse.   °Document Released: 11/04/2004 Document Revised: 11/15/2012 Document Reviewed: 10/04/2012 °Exit[employer]® Patient  Information ©2014 [employer]. ° °

## 2013-04-19 NOTE — ED Notes (Signed)
Pt given cup of ginger ale and is drinking, tolerating well so far. Will re-assess patient in 20 minutes.

## 2013-04-19 NOTE — ED Notes (Signed)
Pt drank all of ginger ale with no nausea or vomiting.

## 2013-04-19 NOTE — ED Provider Notes (Signed)
Received in signout for Dr. Jodi MourningZavitz.  Followup CT. CT negative for obstruction. Patient was able to tolerate by mouth.   Results for orders placed during the hospital encounter of 04/18/13  LIPASE, BLOOD      Result Value Ref Range   Lipase 34  11 - 59 U/L  COMPREHENSIVE METABOLIC PANEL      Result Value Ref Range   Sodium 139  137 - 147 mEq/L   Potassium 3.8  3.7 - 5.3 mEq/L   Chloride 99  96 - 112 mEq/L   CO2 26  19 - 32 mEq/L   Glucose, Bld 92  70 - 99 mg/dL   BUN 12  6 - 23 mg/dL   Creatinine, Ser 1.091.15  0.50 - 1.35 mg/dL   Calcium 8.8  8.4 - 60.410.5 mg/dL   Total Protein 7.6  6.0 - 8.3 g/dL   Albumin 3.9  3.5 - 5.2 g/dL   AST 19  0 - 37 U/L   ALT 14  0 - 53 U/L   Alkaline Phosphatase 104  39 - 117 U/L   Total Bilirubin 0.6  0.3 - 1.2 mg/dL   GFR calc non Af Amer 77 (*) >90 mL/min   GFR calc Af Amer 89 (*) >90 mL/min  CBG MONITORING, ED      Result Value Ref Range   Glucose-Capillary 129 (*) 70 - 99 mg/dL  CBG MONITORING, ED      Result Value Ref Range   Glucose-Capillary 113 (*) 70 - 99 mg/dL   Comment 1 Documented in Chart     Comment 2 Notify RN    I-STAT CHEM 8, ED      Result Value Ref Range   Sodium 140  137 - 147 mEq/L   Potassium 3.6 (*) 3.7 - 5.3 mEq/L   Chloride 98  96 - 112 mEq/L   BUN 11  6 - 23 mg/dL   Creatinine, Ser 5.401.30  0.50 - 1.35 mg/dL   Glucose, Bld 91  70 - 99 mg/dL   Calcium, Ion 9.811.13  1.911.12 - 1.23 mmol/L   TCO2 29  0 - 100 mmol/L   Hemoglobin 16.3  13.0 - 17.0 g/dL   HCT 47.848.0  29.539.0 - 62.152.0 %   Ct Abdomen Pelvis W Contrast  04/19/2013   CLINICAL DATA Abdominal pain  EXAM CT ABDOMEN AND PELVIS WITH CONTRAST  TECHNIQUE Multidetector CT imaging of the abdomen and pelvis was performed using the standard protocol following bolus administration of intravenous contrast.  CONTRAST 80mL OMNIPAQUE IOHEXOL 300 MG/ML  SOLN  COMPARISON 12/05/2012  FINDINGS Mild dependent atelectasis left lung base.  Normal heart size.  No appreciable abnormality of the liver,  spleen, pancreas, biliary system, adrenal glands.  Nonobstructing right renal stone. Symmetric renal enhancement with the exception of a small cyst on the left. No hydroureteronephrosis.  No overt colitis. Appendix not confidently identified. No right lower quadrant inflammation. No bowel obstruction.  Metallic density within the lower abdominal mesentery is similar to prior. No free intraperitoneal air or fluid. No lymphadenopathy.  Normal caliber aorta and branch vessels.  Thin walled bladder. Prostate gland normal size, with nonspecific heterogeneous enhancement.  No acute osseous finding. L5-S1 degenerative disc disease with disc osteophyte complex resulting in mild central canal narrowing.  Locules of gas within the right lateral thigh/gluteal musculature may correspond to an injection site.  IMPRESSION No acute abdominopelvic process identified by CT  SIGNATURE  Electronically Signed   By: Lerry LinerAndrew  DelGaizo M.D.  On: 04/19/2013 02:32   Dg Abd Acute W/chest  04/15/2013   CLINICAL DATA:  Umbilical region pain and diarrhea since this morning. History of small bowel obstructions.  EXAM: ACUTE ABDOMEN SERIES (ABDOMEN 2 VIEW & CHEST 1 VIEW)  COMPARISON:  02/21/2013  FINDINGS: No evidence of a bowel obstruction. No adynamic ileus. No free air.  Abdominal and pelvic soft tissues are unremarkable.  Chest radiograph shows a normal heart, mediastinum hila and clear lungs.  No bony abnormality.  IMPRESSION: Negative abdominal radiographs.  No acute cardiopulmonary disease.   Electronically Signed   By: Amie Portland M.D.   On: 04/15/2013 20:37      Shon Baton, MD 04/19/13 [phone]

## 2013-04-25 NOTE — ED Provider Notes (Signed)
CSN: [id number]     Arrival date & time 04/15/13  1717 History   First MD Initiated Contact with Patient 04/15/13 1843     Chief Complaint  Patient presents with  . Abdominal Pain  . Back Pain  . Urinary Frequency     (Consider location/radiation/quality/duration/timing/severity/associated sxs/prior Treatment) HPI Comments: Triage not also reports urinary frequency, which pt denies to me, but states one of his sexual partners told him to "get tested".  He denies dysuria, penile d/c. He states he has some dripping of urine after he finishes urinating  Patient is a 43 y.o. male presenting with abdominal pain. The history is provided by the patient. No language interpreter was used.  Abdominal Pain Pain location:  LLQ and RLQ Pain quality: aching   Pain radiates to:  Does not radiate Pain severity:  Moderate Duration:  1 day Timing:  Constant Progression:  Unchanged Chronicity:  Recurrent Relieved by:  Nothing Worsened by:  Nothing tried Ineffective treatments:  None tried Associated symptoms: anorexia   Associated symptoms: no chest pain, no constipation, no cough, no diarrhea, no dysuria, no fatigue, no fever, no hematuria, no nausea, no shortness of breath and no vomiting   Risk factors: multiple surgeries     Past Medical History  Diagnosis Date  . Bronchitis   . Asthma   . SBO (small bowel obstruction)   . Gastroenteritis   . Ileus, postoperative    Past Surgical History  Procedure Laterality Date  . Abdominal surgery  43 y/o    ?Malrotation?  . Bowel resection  2012?    [facility]  . Laparotomy N/A 12/07/2012    Procedure: EXPLORATORY LAPAROTOMY LYSIS OF ADHESIONS, and a reduction of internal hernia;  Surgeon: Lodema PilotBrian Layton, DO;  Location: [facility];  Service: General;  Laterality: N/A;   Family History  Problem Relation Age of Onset  . Other Father    History  Substance Use Topics  . Smoking status: Never Smoker   . Smokeless tobacco: Never  Used  . Alcohol Use: No    Review of Systems  Constitutional: Negative for fever, activity change, appetite change and fatigue.  HENT: Negative for congestion, facial swelling, rhinorrhea and trouble swallowing.   Eyes: Negative for photophobia and pain.  Respiratory: Negative for cough, chest tightness and shortness of breath.   Cardiovascular: Negative for chest pain and leg swelling.  Gastrointestinal: Positive for abdominal pain and anorexia. Negative for nausea, vomiting, diarrhea and constipation.  Endocrine: Negative for polydipsia and polyuria.  Genitourinary: Negative for dysuria, urgency, frequency, hematuria, decreased urine volume and difficulty urinating.  Musculoskeletal: Negative for back pain and gait problem.  Skin: Negative for color change, rash and wound.  Allergic/Immunologic: Negative for immunocompromised state.  Neurological: Negative for dizziness, facial asymmetry, speech difficulty, weakness, numbness and headaches.  Psychiatric/Behavioral: Negative for confusion, decreased concentration and agitation.      Allergies  Shellfish allergy  Home Medications   Current Outpatient Rx  Name  Route  Sig  Dispense  Refill  . HYDROcodone-acetaminophen (NORCO) 5-325 MG per tablet   Oral   Take 1 tablet by mouth every 6 (six) hours as needed for moderate pain.   20 tablet   0   . ibuprofen (ADVIL,MOTRIN) 800 MG tablet   Oral   Take 800 mg by mouth every 8 (eight) hours as needed for mild pain.         Marland Kitchen. oxyCODONE-acetaminophen (PERCOCET/ROXICET) 5-325 MG per tablet   Oral  Take 1 tablet by mouth every 4 (four) hours as needed for severe pain.          BP 119/79  Pulse 63  Temp(Src) 98.1 F (36.7 C) (Oral)  Resp 18  SpO2 94% Physical Exam  Constitutional: He is oriented to person, place, and time. He appears well-developed and well-nourished. No distress.  HENT:  Head: Normocephalic and atraumatic.  Mouth/Throat: No oropharyngeal exudate.   Eyes: Pupils are equal, round, and reactive to light.  Neck: Normal range of motion. Neck supple.  Cardiovascular: Normal rate, regular rhythm and normal heart sounds.  Exam reveals no gallop and no friction rub.   No murmur heard. Pulmonary/Chest: Effort normal and breath sounds normal. No respiratory distress. He has no wheezes. He has no rales.  Abdominal: Soft. Bowel sounds are normal. He exhibits no distension and no mass. There is tenderness in the right lower quadrant and left lower quadrant. There is no rigidity, no rebound and no guarding.  Musculoskeletal: Normal range of motion. He exhibits no edema and no tenderness.  Neurological: He is alert and oriented to person, place, and time.  Skin: Skin is warm and dry.  Psychiatric: He has a normal mood and affect.    ED Course  Procedures (including critical care time) Labs Review Labs Reviewed  COMPREHENSIVE METABOLIC PANEL - Abnormal; Notable for the following:    GFR calc non Af Amer 75 (*)    GFR calc Af Amer 86 (*)    All other components within normal limits  URINALYSIS, ROUTINE W REFLEX MICROSCOPIC  CBC WITH DIFFERENTIAL  I-STAT TROPOININ, ED   Imaging Review No results found.   EKG Interpretation   Date/Time:  Sunday April 15 2013 16:27:58 EDT Ventricular Rate:  93 PR Interval:  140 QRS Duration: 76 QT Interval:  328 QTC Calculation: 407 R Axis:   60 Text Interpretation:  Normal sinus rhythm Normal ECG ED PHYSICIAN  INTERPRETATION AVAILABLE IN [facility] Reconfirmed by DOCHERTY  MD,  MEGAN [phone]) on 04/25/2013 12:33:24 PM      MDM   Final diagnoses:  Abdominal pain    Pt is a 43 y.o. male with Pmhx as above who presents with low ab pain.  Hx of SBO obstruction in the past.  He states normally when he comes to ED for abdominal pain "they put in an IV, give me dilaudid every couple hours" get imaging, and if he's not obstructed he goes home. Triage not also reports urinary frequency, which pt denies  to me, but states one of his sexual partners told him to "get tested" today.  He denies fevers, n/v, d/a, dysuria, hematuria, penile d/c or frequency.  On exam, VSS, pt in NAD. +low abdominal tenderness w/o rebound or guarding. Bowel sounds nml.   CBC, CMP, UA unremarkable. AAS show no signs of obstruction. Plan to continue to treat symptomatically, PO challlenge.  Doubt SBO and given unremarkable w/U feel he will require no further imaging if symptoms improve after 2nd dose IV dilaudid.  Care transferred to PA Kirichenko.        Shanna Cisco, MD 04/25/13 [phone]

## 2013-06-03 ENCOUNTER — Encounter (HOSPITAL_COMMUNITY): Payer: Self-pay | Admitting: Emergency Medicine

## 2013-06-03 ENCOUNTER — Emergency Department (HOSPITAL_COMMUNITY)
Admission: EM | Admit: 2013-06-03 | Discharge: 2013-06-03 | Disposition: A | Discharge status: Home / Self Care | Payer: Medicaid Other | Attending: Emergency Medicine | Admitting: Emergency Medicine

## 2013-06-03 ENCOUNTER — Emergency Department (HOSPITAL_COMMUNITY): Payer: Medicaid Other

## 2013-06-03 DIAGNOSIS — Z9889 Other specified postprocedural states: Secondary | ICD-10-CM | POA: Insufficient documentation

## 2013-06-03 DIAGNOSIS — J45909 Unspecified asthma, uncomplicated: Secondary | ICD-10-CM | POA: Insufficient documentation

## 2013-06-03 DIAGNOSIS — R109 Unspecified abdominal pain: Secondary | ICD-10-CM

## 2013-06-03 DIAGNOSIS — Z8719 Personal history of other diseases of the digestive system: Secondary | ICD-10-CM | POA: Insufficient documentation

## 2013-06-03 LAB — COMPREHENSIVE METABOLIC PANEL
Alkaline Phosphatase: 93 U/L (ref 39–117)
BUN: 13 mg/dL (ref 6–23)
Calcium: 9 mg/dL (ref 8.4–10.5)
Chloride: 100 mEq/L (ref 96–112)
GFR calc Af Amer: 88 mL/min — ABNORMAL LOW (ref >90)
Glucose, Bld: 82 mg/dL (ref 70–99)
Potassium: 3.8 mEq/L (ref 3.7–5.3)
Total Bilirubin: 0.3 mg/dL (ref 0.3–1.2)
Total Protein: 6.9 g/dL (ref 6.0–8.3)

## 2013-06-03 LAB — CBC WITH DIFFERENTIAL/PLATELET
Basophils Absolute: 0 K/uL (ref 0.0–0.1)
Basophils Relative: 0 % (ref 0–1)
Eosinophils Absolute: 0.4 10*3/uL (ref 0.0–0.7)
Eosinophils Relative: 8 % — ABNORMAL HIGH (ref 0–5)
HCT: 42.4 % (ref 39.0–52.0)
Hemoglobin: 15.2 g/dL (ref 13.0–17.0)
Lymphocytes Relative: 32 % (ref 12–46)
Lymphs Abs: 1.5 10*3/uL (ref 0.7–4.0)
MCH: 30.5 pg (ref 26.0–34.0)
MCHC: 35.8 g/dL (ref 30.0–36.0)
MCV: 85 fL (ref 78.0–100.0)
Monocytes Absolute: 0.4 K/uL (ref 0.1–1.0)
Monocytes Relative: 9 % (ref 3–12)
Neutro Abs: 2.4 K/uL (ref 1.7–7.7)
Neutrophils Relative %: 51 % (ref 43–77)
Platelets: 173 K/uL (ref 150–400)
RBC: 4.99 MIL/uL (ref 4.22–5.81)
RDW: 12.9 % (ref 11.5–15.5)
WBC: 4.7 K/uL (ref 4.0–10.5)

## 2013-06-03 LAB — URINALYSIS, ROUTINE W REFLEX MICROSCOPIC
Bilirubin Urine: NEGATIVE
Glucose, UA: NEGATIVE mg/dL
Hgb urine dipstick: NEGATIVE
Ketones, ur: NEGATIVE mg/dL
Leukocytes, UA: NEGATIVE
Nitrite: NEGATIVE
Protein, ur: NEGATIVE mg/dL
Specific Gravity, Urine: 1.025 (ref 1.005–1.030)
Urobilinogen, UA: 1 mg/dL (ref 0.0–1.0)
pH: 7 (ref 5.0–8.0)

## 2013-06-03 LAB — COMPREHENSIVE METABOLIC PANEL WITH GFR
ALT: 12 U/L (ref 0–53)
AST: 15 U/L (ref 0–37)
Albumin: 3.6 g/dL (ref 3.5–5.2)
CO2: 26 meq/L (ref 19–32)
Creatinine, Ser: 1.16 mg/dL (ref 0.50–1.35)
GFR calc non Af Amer: 76 mL/min — ABNORMAL LOW (ref >90)
Sodium: 138 meq/L (ref 137–147)

## 2013-06-03 LAB — LIPASE, BLOOD: Lipase: 33 U/L (ref 11–59)

## 2013-06-03 MED ORDER — HYDROMORPHONE HCL PF 1 MG/ML IJ SOLN
1.0000 mg | Freq: Once | INTRAMUSCULAR | Status: AC
  Administered 2013-06-03: 1 mg via INTRAMUSCULAR
  Filled 2013-06-03: qty 1

## 2013-06-03 NOTE — Discharge Instructions (Signed)
Abdominal Pain, Adult Many things can cause abdominal pain. Usually, abdominal pain is not caused by a disease and will improve without treatment. It can often be observed and treated at home. Your health care provider will do a physical exam and possibly order blood tests and X-rays to help determine the seriousness of your pain. However, in many cases, more time must pass before a clear cause of the pain can be found. Before that point, your health care provider may not know if you need more testing or further treatment. HOME CARE INSTRUCTIONS  Monitor your abdominal pain for any changes. The following actions may help to alleviate any discomfort you are experiencing:  Only take over-the-counter or prescription medicines as directed by your health care provider.  Do not take laxatives unless directed to do so by your health care provider.  Try a clear liquid diet (broth, tea, or water) as directed by your health care provider. Slowly move to a bland diet as tolerated. SEEK MEDICAL CARE IF:  You have unexplained abdominal pain.  You have abdominal pain associated with nausea or diarrhea.  You have pain when you urinate or have a bowel movement.  You experience abdominal pain that wakes you in the night.  You have abdominal pain that is worsened or improved by eating food.  You have abdominal pain that is worsened with eating fatty foods. SEEK IMMEDIATE MEDICAL CARE IF:   Your pain does not go away within 2 hours.  You have a fever.  You keep throwing up (vomiting).  Your pain is felt only in portions of the abdomen, such as the right side or the left lower portion of the abdomen.  You pass bloody or black tarry stools. MAKE SURE YOU:  Understand these instructions.   Will watch your condition.   Will get help right away if you are not doing well or get worse.  Document Released: 11/04/2004 Document Revised: 11/15/2012 Document Reviewed: 10/04/2012 [facility] Patient  Information 2014 [place], [place].   You will be notified if your tests that are still being processed reveals an infection

## 2013-06-03 NOTE — ED Notes (Signed)
Pt. returned from XR. 

## 2013-06-03 NOTE — ED Notes (Signed)
The pt is c/o abd pain for several weeks he was seen here for the same 3-4 weeks ago.  n v diarrhea

## 2013-06-03 NOTE — ED Notes (Signed)
MD at bedside. 
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
## 2013-06-03 NOTE — ED Notes (Signed)
Patient transported to X-ray 

## 2013-06-04 LAB — HIV ANTIBODY (ROUTINE TESTING W REFLEX): HIV 1&2 Ab, 4th Generation: NONREACTIVE

## 2013-06-05 ENCOUNTER — Telehealth (HOSPITAL_BASED_OUTPATIENT_CLINIC_OR_DEPARTMENT_OTHER): Payer: Self-pay

## 2013-06-05 LAB — GC/CHLAMYDIA PROBE AMP
CT Probe RNA: NEGATIVE
GC Probe RNA: NEGATIVE

## 2013-06-05 NOTE — Telephone Encounter (Signed)
Pt calling for STD results.  ID verified x 2.  Pt informed both Gonorrhea and Chlamydia are negative and HIV is non reactive 

## 2013-11-06 IMAGING — CR DG ABDOMEN ACUTE W/ 1V CHEST
3 series · 3 of 3 positions shown · non-contrast
Comparison: November 27, 2012

CLINICAL DATA: abdomen pain with nausea and vomiting

EXAM:
ACUTE ABDOMEN SERIES (ABDOMEN 2 VIEW & CHEST 1 VIEW)

[w chest pa]
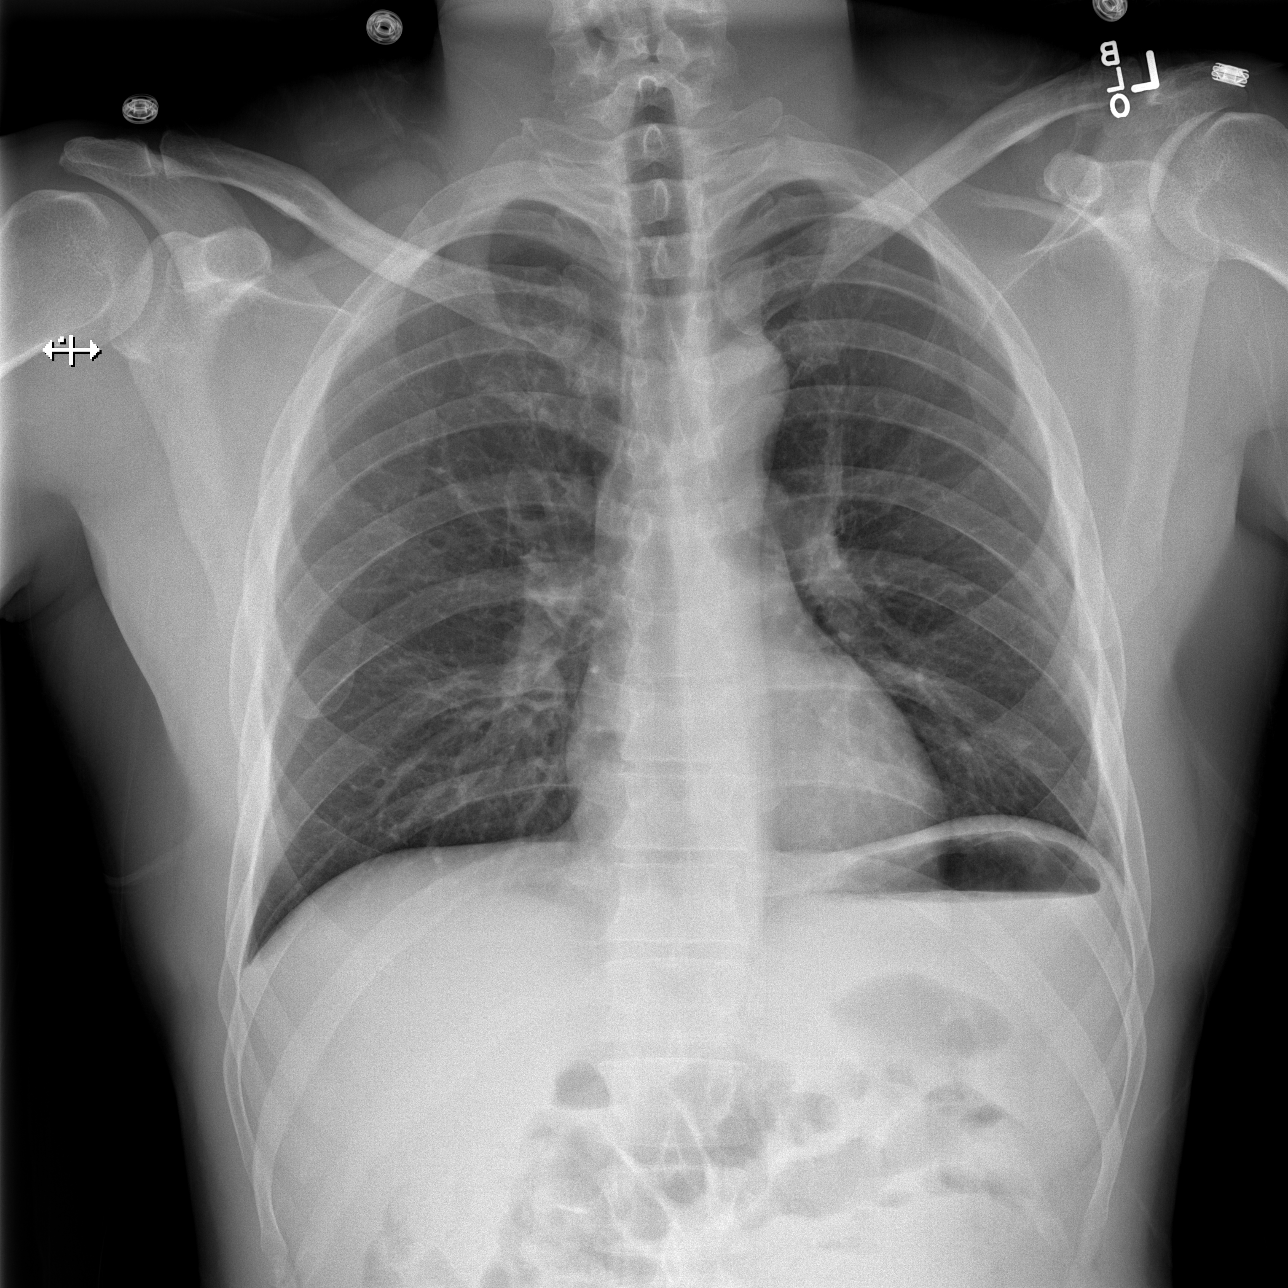

[w abdomen upright]
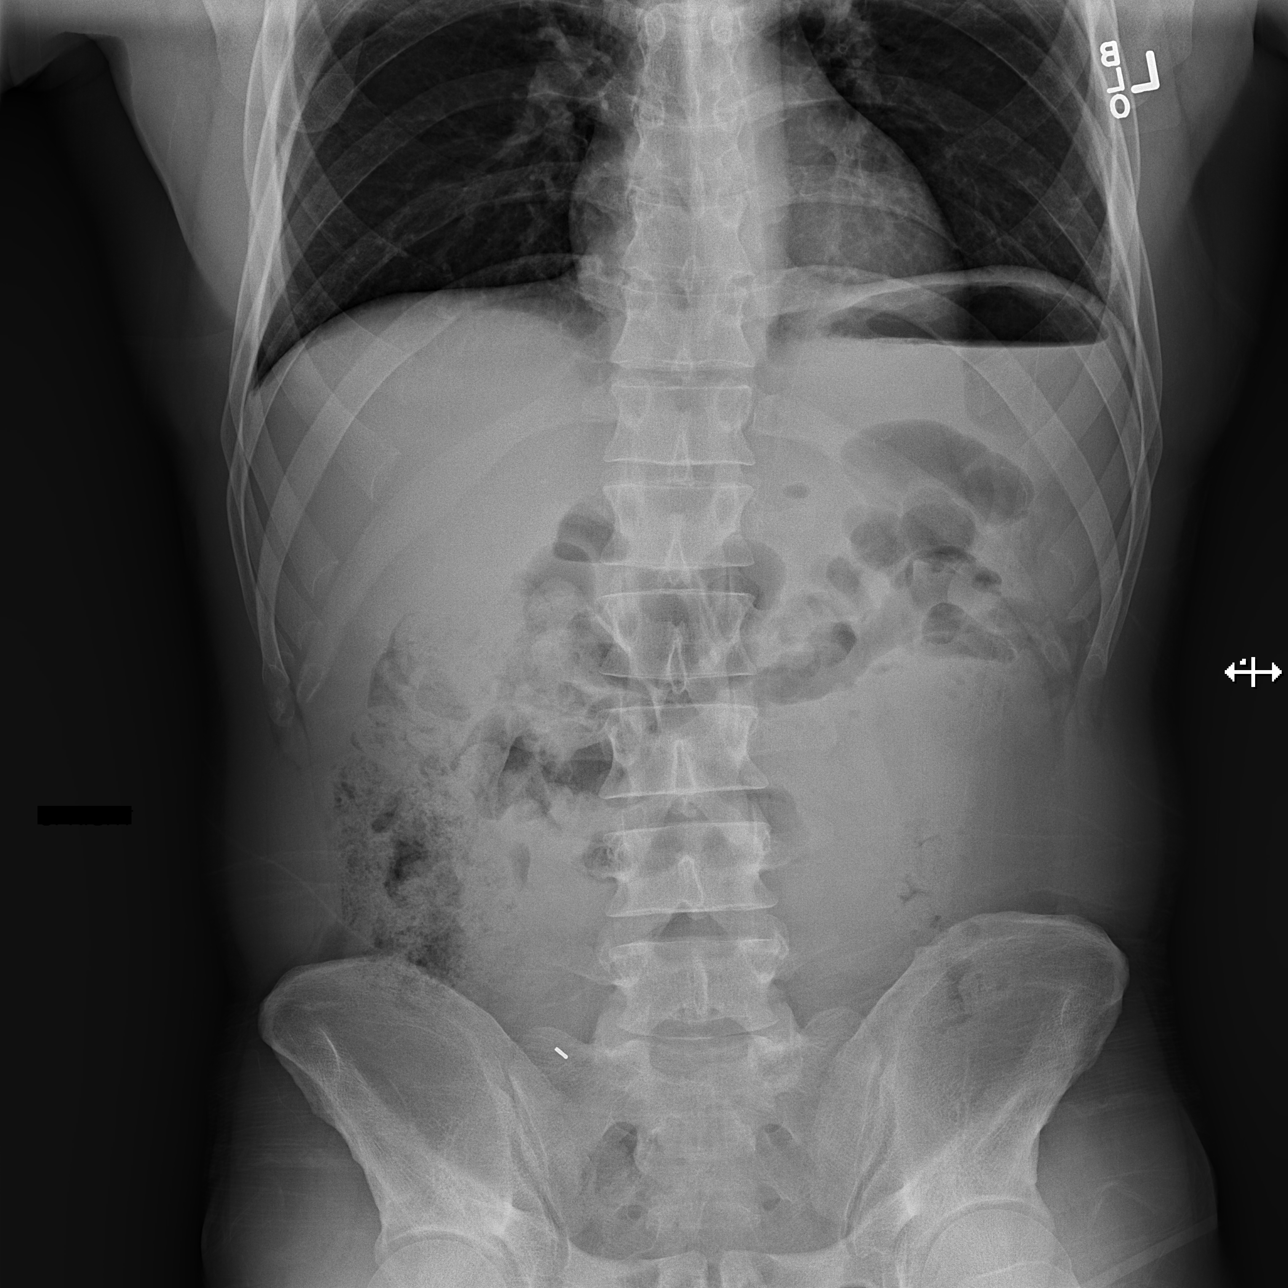

[t abdomen supine]
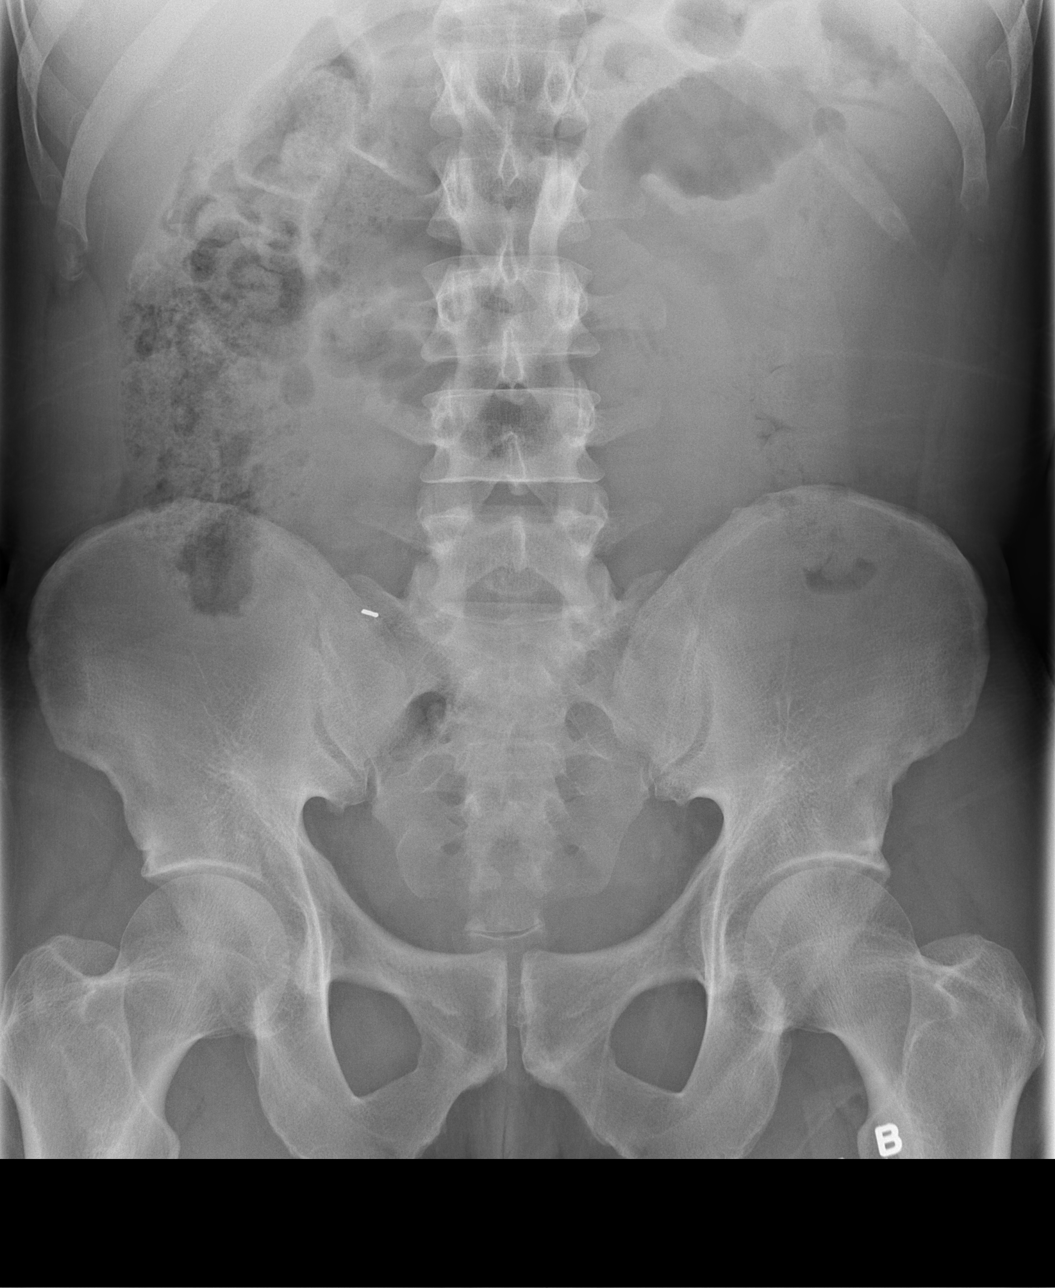

[3 of 3 positions shown; findings below may reference images not displayed]

FINDINGS: PA chest: Lungs are clear. Heart size and pulmonary vascularity are
normal. No adenopathy.

Supine and upright abdomen: There is moderate stool throughout the
colon. There is a loop of mildly dilated small bowel in the left mid
abdomen. There are several air-fluid levels. No free air. There is a
surgical clip in the right lower quadrant.
IMPRESSION: Bowel gas pattern is suggestive of enteritis or early ileus.
Obstruction is felt to be less likely. No free air. No edema or
consolidation.

## 2013-11-06 IMAGING — CT CT ABD-PELV W/ CM
1 of 3 series · 14 of 32 positions shown, 19 images · IV contrast (OMNIPAQUE 300)
Comparison: 06/29/2012

CLINICAL DATA: Sharp abdominal pain, history of small-bowel
obstruction

EXAM:
CT ABDOMEN AND PELVIS WITH CONTRAST
TECHNIQUE: Multidetector CT imaging of the abdomen and pelvis was performed
using the standard protocol following bolus administration of
intravenous contrast.
CONTRAST:  100mL OMNIPAQUE IOHEXOL 300 MG/ML  SOLN

[Series 2: abd/pel with · axial · 0.72mm/px · z∈[-382,-12]mm · 14 of 84 slices shown, 19 images]
[im 5/84  soft-tissue]
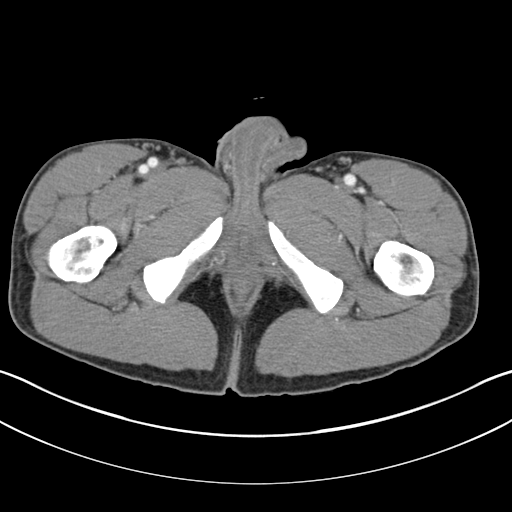
[im 5/84  bone]
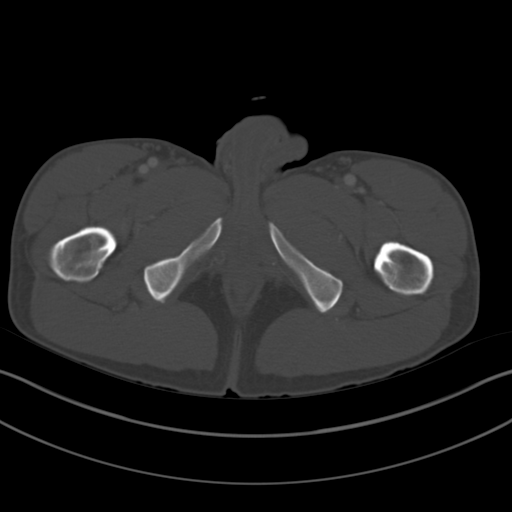
[im 13/84  soft-tissue]
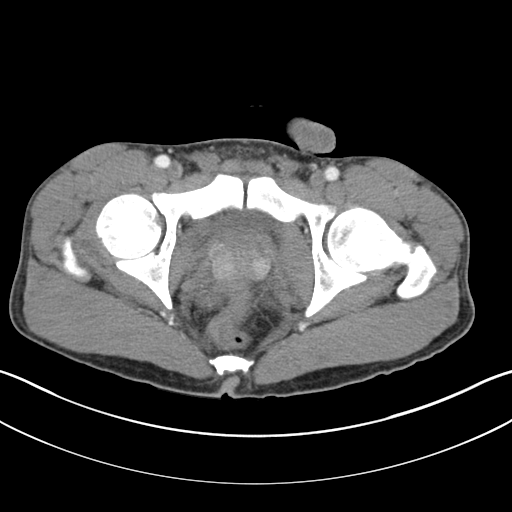
[im 17/84  soft-tissue]
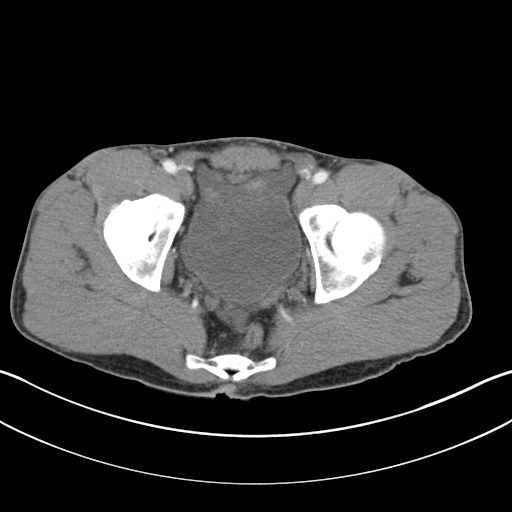
[im 25/84  soft-tissue]
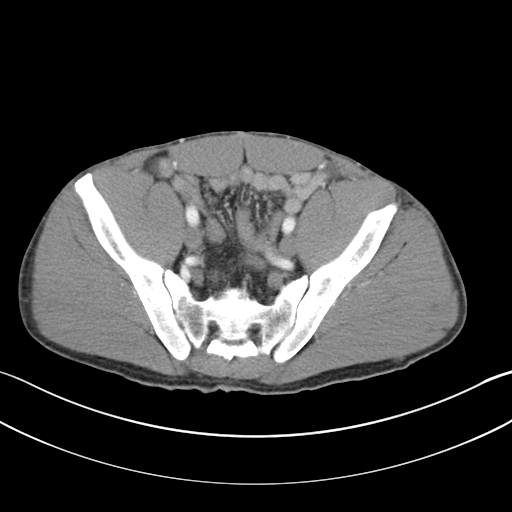
[im 30/84  soft-tissue]
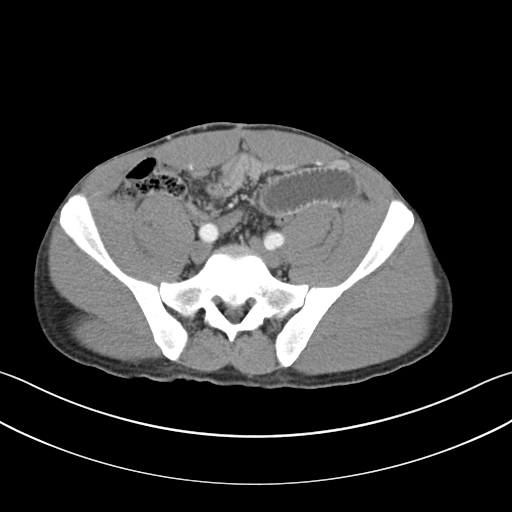
[im 38/84  soft-tissue]
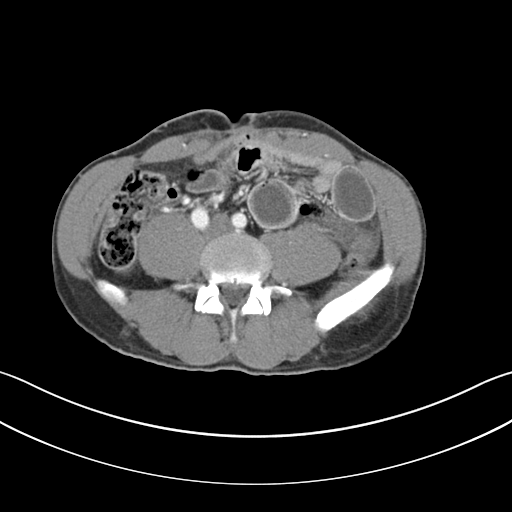
[im 42/84  soft-tissue]
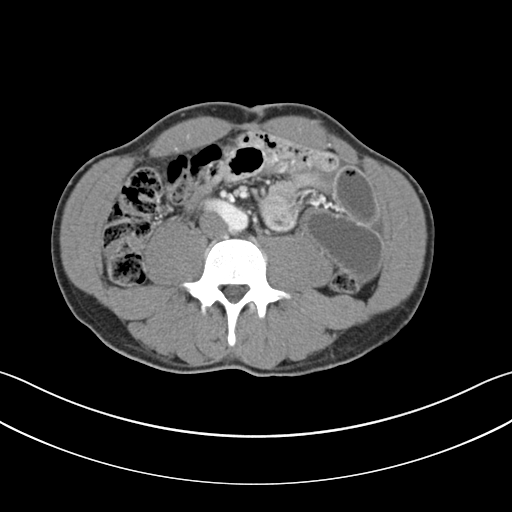
[im 46/84  soft-tissue]
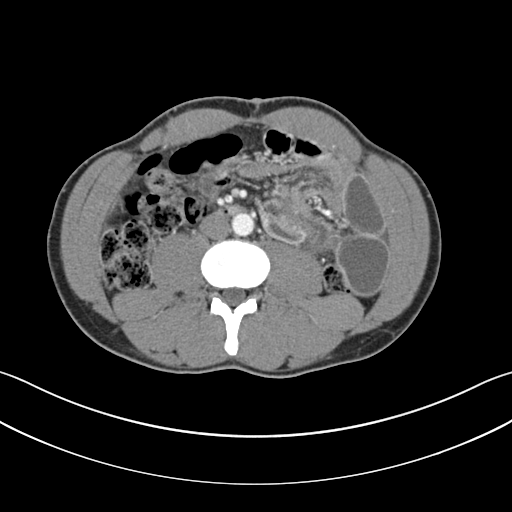
[im 54/84  soft-tissue]
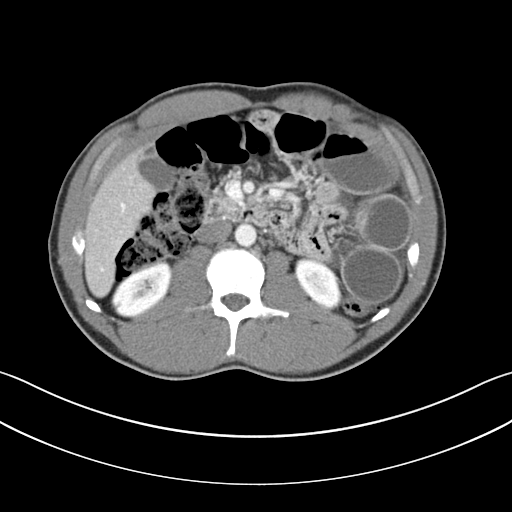
[im 54/84  bone]
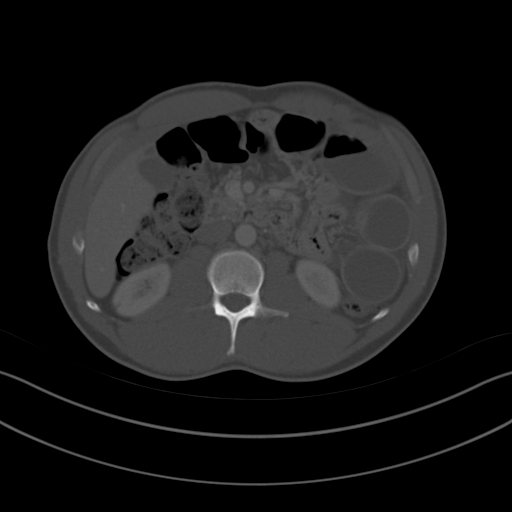
[im 59/84  soft-tissue]
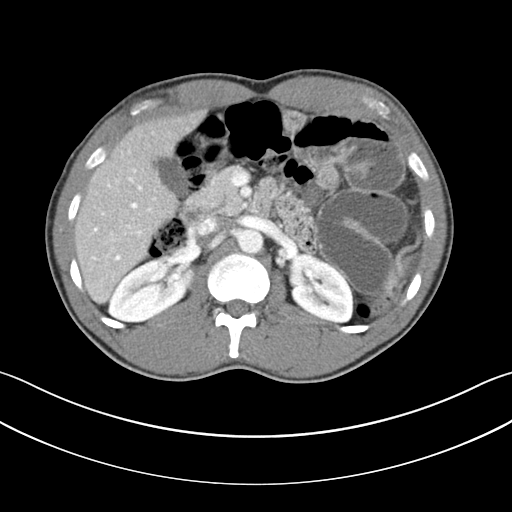
[im 67/84  soft-tissue]
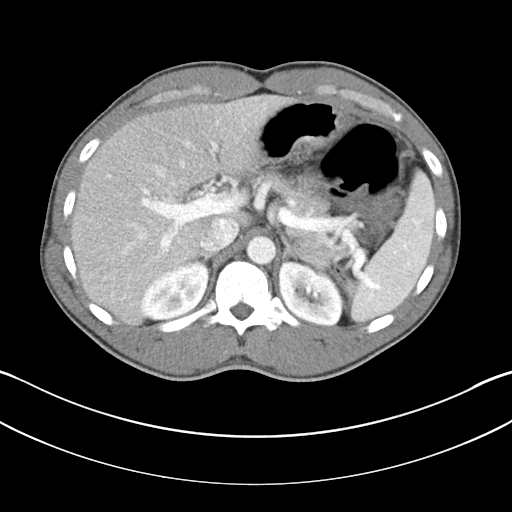
[im 67/84  lung]
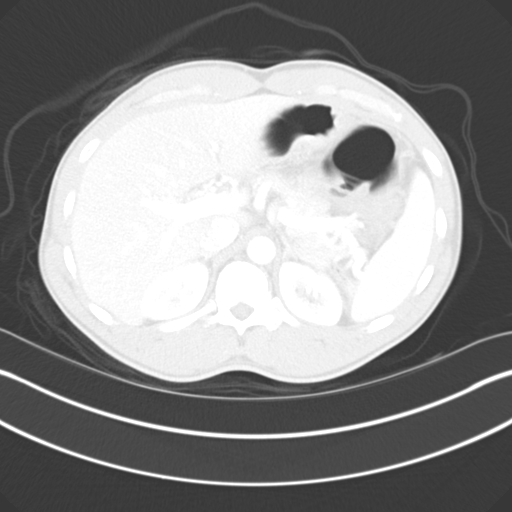
[im 71/84  soft-tissue]
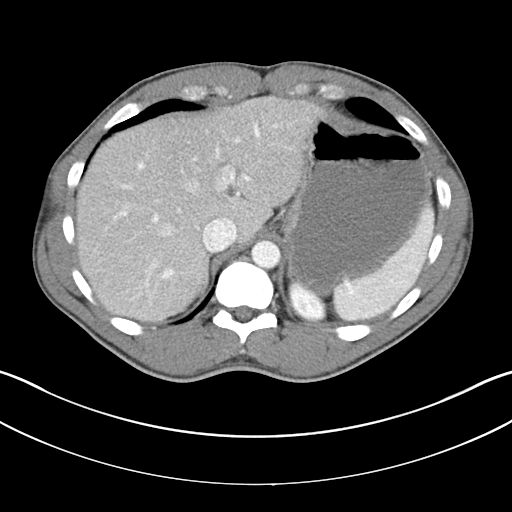
[im 71/84  lung]
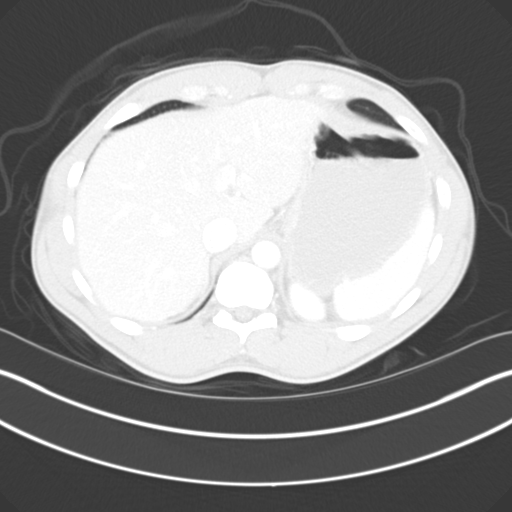
[im 75/84  lung]
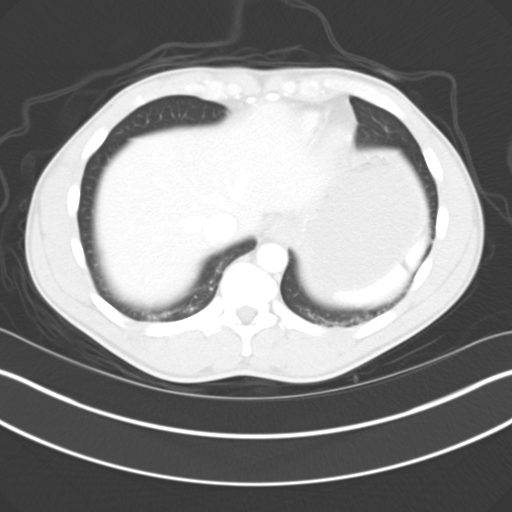
[im 79/84  soft-tissue]
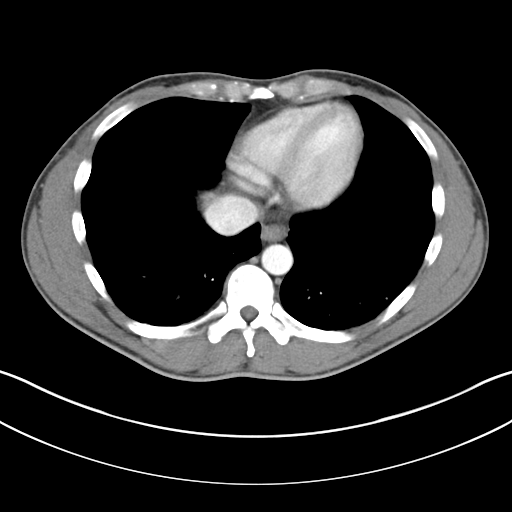
[im 79/84  lung]
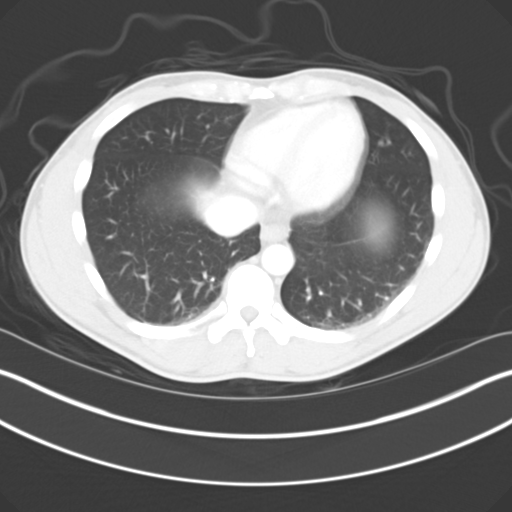

[14 of 32 positions shown; findings below may reference images not displayed]

FINDINGS: Minor dependent basilar atelectasis. Normal heart size. No
pericardial or pleural effusion. No hiatal hernia.

Abdomen: Fluid filled dilated loops of small bowel in the left
abdomen diffusely compatible with a small bowel obstruction in the
jejunum. Small amount left upper quadrant free fluid and mesenteric
edema/ vascular congestion. No evidence of perforation or free air.
Dilated loops are angulated. This could be related to adhesions from
prior abdominal surgery versus an internal hernia. No evidence of
definite mass or adenopathy.

Liver, gallbladder, biliary system, pancreas, spleen, adrenal
glands, and kidneys are within normal limits for age and demonstrate
no acute process. Stool noted in the right colon diffusely. Distal
colon is decompressed.

No fluid collection, hemorrhage, abscess or adenopathy.

Pelvis: Small amount of pelvic free fluid. Urinary bladder
unremarkable. Distal small bowel is completely collapsed. No fluid
collection, hemorrhage, adenopathy, inguinal abnormality, or hernia.

No osseous abnormality.
IMPRESSION: Left upper quadrant small bowel obstruction pattern within the
jejunum, suspect related to adhesions or possibly an internal
hernia.

Negative for free air or an abscess

Small amount of abdominal and pelvic free fluid.

## 2014-06-02 NOTE — Discharge Summary (Signed)
PATIENT NAME:  Gregory Castro, Gregory Castro MR#:  [id number] DATE OF BIRTH:  [birth year]  DATE OF ADMISSION:  01/31/2011 DATE OF DISCHARGE:  02/12/2011  BRIEF HISTORY: Gregory BrandyLouis Cui is a 44 year old gentleman seen in the emergency room with signs and symptoms consistent with a small bowel obstruction. He had a previous history of surgery as a child for an intestinal problem. The results were successful and he did not have any serious problems since that time. The sudden onset of the symptoms suggested a small bowel obstruction. He was admitted to the hospital, treated with nasogastric decompression and IV rehydration. His plain films did not improve. The surgery was recommended on 02/02/2011. He wanted to delay another 24 hours as he felt that his symptoms were improving. However, the following day he was no better and was taken to surgery on 02/03/2011 for laparotomy and lysis of adhesions. One band adhesion in the right lower quadrant responsible for his obstruction. The small bowel appeared dusky, edematous, and erythematous. There did not appear to be any evidence of any nonviable small bowel. No resection was undertaken. He had very slow return of bowel function, was not able to discontinue nasogastric suction until February 09, 3011. He was then started on a liquid diet, advanced to a soft diet.  He was discharged back to the [facility] on 02/12/2011. He was discharged on Percocet for pain. He will be followed in the office in 7 to 10 days' time.   FINAL DISCHARGE DIAGNOSIS: Small bowel obstruction.    SURGERY:  Laparotomy with lysis of adhesions.    ____________________________ Quentin Orealph L. Ely III, MD rle:vtd D: 02/18/2011 21:19:00 ET T: 02/20/2011 12:50:48 ET JOB#: [id number]  cc: Quentin Orealph L. Ely III, MD, <Dictator> Quentin OreALPH L ELY MD ELECTRONICALLY SIGNED 02/27/2011 7:22
